# Patient Record
Sex: Male | Born: 1970
Health system: Southern US, Community
[De-identification: ages and names within clinical notes are randomized; demographics above are authoritative.]

## PROBLEM LIST (undated history)

## (undated) DIAGNOSIS — M419 Scoliosis, unspecified: Secondary | ICD-10-CM

## (undated) DIAGNOSIS — I341 Nonrheumatic mitral (valve) prolapse: Secondary | ICD-10-CM

## (undated) DIAGNOSIS — T148XXA Other injury of unspecified body region, initial encounter: Secondary | ICD-10-CM

## (undated) DIAGNOSIS — M722 Plantar fascial fibromatosis: Secondary | ICD-10-CM

## (undated) HISTORY — PX: WISDOM TOOTH EXTRACTION: SHX21

## (undated) HISTORY — DX: Plantar fascial fibromatosis: M72.2

## (undated) HISTORY — PX: VASECTOMY: SHX75

---

## 2007-05-18 ENCOUNTER — Ambulatory Visit: Payer: Self-pay | Admitting: Cardiology

## 2007-05-22 ENCOUNTER — Ambulatory Visit: Payer: Self-pay

## 2007-05-22 ENCOUNTER — Encounter: Payer: Self-pay | Admitting: Cardiology

## 2007-05-22 ENCOUNTER — Ambulatory Visit: Payer: Self-pay | Admitting: Cardiology

## 2010-11-27 NOTE — Assessment & Plan Note (Signed)
[facility]                            CARDIOLOGY OFFICE NOTE   Christopher Maxwell, Christopher Maxwell                      MRN:          [id number]  DATE:05/18/2007                            DOB:          Feb 15, 1971    CARDIOLOGY CONSULTATION:   REQUESTING Zeidy Tayag:  Paulene Floor, Nurse Practitioner with [facility].   REASON FOR VISIT:  Palpitations and history of mitral valve prolapse.   HISTORY OF PRESENT ILLNESS:  Christopher Maxwell is a pleasant 40 year old male  with a possible history of mitral valve prolapse diagnosed around age  83.  He reports being told at that time that he had mitral valve  prolapse based on physical examination and in the setting of episodic  rapid palpitations that typically lasted no more than 5 minutes.  Some  of these episodes would occur suddenly with activity, although he did  have episodes at rest and was not particularly bothered by them,  occurring perhaps only once a year.  He never had any associated  syncope.  He has not been on any specific medications for this.  He  reports over the last year he has had 2-3 episodes, the most recent of  which lasted up to 30 minutes and occurred approximately 3 weeks ago,  waking him up from sleep.  Other than this he continues to have no  problems with syncope and has had no exertional chest pain or  breathlessness.  His electrocardiogram today shows a sinus rhythm with a  fairly vertical axis and an incomplete right bundle branch block pattern  with decreased R wave progression noted anteriorly.  The pattern is not  classic for Brugada.  He also has a borderline short PR interval of 156  milliseconds with some slurring of the initial portion of the QRS  raising the possibility of pre-excitation.  He had a tracing sent over  from [facility] that shows similar changes.  He  does report wearing a monitor many years ago as a child but nothing more  recently, and he has not undergone an echocardiogram.   ALLERGIES:  PENICILLIN.   PRESENT MEDICATIONS:  None.   SOCIAL HISTORY:  The patient is married.  He has one child.  He works as  a Insurance account manager.  He works on the  night shift up to 14 hours a day and reports increased stress with his  job.  He drinks approximately 2 alcoholic beverages a day.  He smokes 1  pack per day, he has done so for 20 years.  No illicit substance use  since 1991.  No regular exercise at this time.   FAMILY HISTORY:  Noncontributory for premature cardiovascular disease or  sudden cardiac death.  Both mother and father are alive in their 3s to  33s.   REVIEW OF SYSTEMS:  Described in the history of present illness.  The  patient reports back problems/scoliosis and has seen a chiropractor for  this.   PAST MEDICAL HISTORY:  As outlined above.  The patient reports wisdom  teeth extraction in the  past.  No other major surgeries or hospital  stays.   PHYSICAL EXAMINATION:  VITAL SIGNS:  Blood pressure today is 134/91,  heart rate is 77, weight is 123 pounds.  GENERAL:  This is a thin male in no acute distress.  HEENT:  Conjunctivae and lids are normal.  Pharynx is clear.  NECK:  Supple.  No elevated jugular venous pressure .  No loud bruits.  No thyromegaly is noted.  LUNGS:  Clear without labored breathing.  CARDIAC:  Reveals a regular rate and rhythm.  No obvious mid systolic  click is elicited either seated or standing.  No loud cardiac murmur.  No S3 gallop or pericardial rub.  CHEST:  Does have a pectus excavatum appearance.  ABDOMEN:  Soft, nontender.  Normoactive bowel sounds.  EXTREMITIES:  Show no significant pitting edema.  Distal pulses are 2  plus.  SKIN:  Warm and dry.  MUSCULOSKELETAL:  Scoliosis is noted.  NEUROPSYCHIATRIC:  The patient is alert and oriented x3.  Affect is  normal.   IMPRESSION/RECOMMENDATION:  Episodic palpitations without  syncope or  chest pain.  This is in the setting of a reported history of mitral  valve prolapse based on a childhood diagnosis.  His examination is not  notable for a mid systolic click at this time.  He does have a relative  pectus excavatum appearance, however.  His electrocardiogram does have  some subtle changes raising the possibility of a concealed pathway  perhaps.  No frank evidence of Brugada pattern.  At this point, I have  recommended a 2D echocardiogram to better assess valvular structure and  function and also a 30-day event recorder.  I will review the patient's  tracing with one of my electrophysiology colleagues.  Otherwise, I will  plan to see the patient back over the next month and we can discuss the  situation from there.   Further plans to follow.     Jonelle Sidle, MD  Electronically Signed    SGM/MedQ  DD: 05/18/2007  DT: 05/18/2007  Job #: [id number]   cc:   Paulene Floor, NP

## 2012-10-26 ENCOUNTER — Telehealth: Payer: Self-pay | Admitting: Nurse Practitioner

## 2012-10-26 ENCOUNTER — Encounter: Payer: Self-pay | Admitting: Family Medicine

## 2012-10-26 ENCOUNTER — Ambulatory Visit (INDEPENDENT_AMBULATORY_CARE_PROVIDER_SITE_OTHER): Payer: BC Managed Care – PPO | Admitting: Family Medicine

## 2012-10-26 VITALS — BP 118/83 | HR 78 | Temp 98.9°F | Ht 71.25 in | Wt 162.6 lb

## 2012-10-26 DIAGNOSIS — Z20828 Contact with and (suspected) exposure to other viral communicable diseases: Secondary | ICD-10-CM

## 2012-10-26 DIAGNOSIS — J209 Acute bronchitis, unspecified: Secondary | ICD-10-CM | POA: Insufficient documentation

## 2012-10-26 DIAGNOSIS — R509 Fever, unspecified: Secondary | ICD-10-CM

## 2012-10-26 LAB — POCT INFLUENZA A/B
Influenza A, POC: NEGATIVE
Influenza B, POC: NEGATIVE

## 2012-10-26 MED ORDER — AZITHROMYCIN 250 MG PO TABS: ORAL_TABLET | ORAL | Status: DC

## 2012-10-26 MED ORDER — OSELTAMIVIR PHOSPHATE 75 MG PO CAPS: 75.0000 mg | ORAL_CAPSULE | Freq: Two times a day (BID) | ORAL | Status: DC

## 2012-10-26 MED ORDER — HYDROCODONE-HOMATROPINE 5-1.5 MG/5ML PO SYRP: 5.0000 mL | ORAL_SOLUTION | Freq: Three times a day (TID) | ORAL | Status: DC | PRN

## 2012-10-26 NOTE — Progress Notes (Signed)
Patient ID: Christopher Maxwell, male   DOB: [birth year], 42 y.o.   MRN: [id number] SUBJECTIVE: HPI: Sore throat,, fever, for  3 days. Now coughing and having chills. Ex-smoker.no wheezing. No hemoptysis. No SOB. No Chest pain.  PMH/PSH: reviewed/updated in Epic  SH/FH: reviewed/updated in Epic  Allergies: reviewed/updated in Epic  Medications: reviewed/updated in Epic  Immunizations: reviewed/updated in Epic  ROS: As above in the HPI. All other systems are stable or negative.  OBJECTIVE: APPEARANCE:  WM NAD rattling cough Patient in no acute distress.The patient appeared well nourished and normally developed. Acyanotic. Waist: VITAL SIGNS: BP 118/83  Pulse 78  Temp(Src) 98.9 F (37.2 C) (Oral)  Ht 5' 11.25" (1.81 m)  Wt 162 lb 9.6 oz (73.755 kg)  BMI 22.51 kg/m2  SKIN: warm and  Dry without overt rashes, tattoos and scars  HEAD and Neck: without JVD, Head and scalp: normal Eyes:No scleral icterus. Fundi normal, eye movements normal. Ears: Auricle normal, canal normal, Tympanic membranes normal, insufflation normal. Nose: normal Throat: normal Neck & thyroid: normal  CHEST & LUNGS: Chest wall: normal Lungs:bilateral  rhonchi , no rales, no wheezes,  CVS: Reveals the PMI to be normally located. Regular rhythm, First and Second Heart sounds are normal,  absence of murmurs, rubs or gallops. Peripheral vasculature: Radial pulses: normal Dorsal pedis pulses: normal Posterior pulses: normal  ABDOMEN:  Appearance: normal Benign,, no organomegaly, no masses, no Abdominal Aortic enlargement. No Guarding , no rebound. No Bruits. Bowel sounds: normal  RECTAL: N/A GU: N/A  EXTREMETIES: nonedematous. Both Femoral and Pedal pulses are normal.  MUSCULOSKELETAL:  Spine: normal Joints: intact  NEUROLOGIC: oriented to time,place and person; nonfocal. Strength is normal Sensory is normal Reflexes are normal Cranial Nerves are normal.  ASSESSMENT: Fever, unspecified  - Plan: POCT Influenza A/B  Exposure to influenza  Acute bronchitis  PLAN:  Orders Placed This Encounter  Procedures  . POCT Influenza A/B   Results for orders placed in visit on 10/26/12 (from the past 24 hour(s))  POCT INFLUENZA A/B     Status: None   Collection Time    10/26/12  5:46 PM      Result Value Range   Influenza A, POC Negative     Influenza B, POC Negative     Meds ordered this encounter  Medications  . Cetirizine HCl (ZYRTEC ALLERGY PO)    Sig: Take by mouth.  Marland Kitchen azithromycin (ZITHROMAX) 250 MG tablet    Sig: 2 tabs on day 1 ,then  1 tab day 2 to 5    Dispense:  6 tablet    Refill:  0  . oseltamivir (TAMIFLU) 75 MG capsule    Sig: Take 1 capsule (75 mg total) by mouth 2 (two) times daily.    Dispense:  10 capsule    Refill:  0  . HYDROcodone-homatropine (HYCODAN) 5-1.5 MG/5ML syrup    Sig: Take 5 mLs by mouth every 8 (eight) hours as needed for cough.    Dispense:  120 mL    Refill:  0   OOW for  5 days Fluids rest RTC prn  Kevron Patella P. Modesto Charon, M.D.

## 2012-11-26 NOTE — Telephone Encounter (Signed)
Pt was seen on 4/14

## 2014-04-27 ENCOUNTER — Encounter: Payer: Self-pay | Admitting: Family

## 2014-04-27 ENCOUNTER — Ambulatory Visit (INDEPENDENT_AMBULATORY_CARE_PROVIDER_SITE_OTHER): Payer: BC Managed Care – PPO | Admitting: Family

## 2014-04-27 VITALS — BP 117/79 | HR 106 | Temp 98.1°F | Ht 71.0 in | Wt 168.8 lb

## 2014-04-27 DIAGNOSIS — M722 Plantar fascial fibromatosis: Secondary | ICD-10-CM

## 2014-04-27 MED ORDER — KETOROLAC TROMETHAMINE 60 MG/2ML IM SOLN
60.0000 mg | Freq: Once | INTRAMUSCULAR | Status: AC
  Administered 2014-04-27: 60 mg via INTRAMUSCULAR

## 2014-04-27 MED ORDER — MELOXICAM 15 MG PO TABS: 15.0000 mg | ORAL_TABLET | Freq: Every day | ORAL | Status: DC

## 2014-04-27 NOTE — Progress Notes (Signed)
   Subjective:    Patient ID: Christopher Maxwell, male    DOB: [birth year], 43 y.o.   MRN: [id number]  Ankle Pain  The incident occurred more than 1 week ago (Two weeks). There was no injury mechanism. The pain is present in the left heel and left ankle. The quality of the pain is described as shooting and stabbing. The pain is at a severity of 7/10. The pain is mild. The pain has been intermittent since onset. Pertinent negatives include no inability to bear weight, loss of motion, numbness or tingling. The symptoms are aggravated by weight bearing. He has tried NSAIDs and rest for the symptoms. The treatment provided mild relief.   *Pt saw Dr. Ulice Brilliantrake who did a X-ray which is WNL. Pt was giving steroid dose pack and antiinflammatory.    Review of Systems  Constitutional: Negative.   HENT: Negative.   Respiratory: Negative.   Cardiovascular: Negative.   Gastrointestinal: Negative.   Endocrine: Negative.   Genitourinary: Negative.   Musculoskeletal: Negative.   Neurological: Negative.  Negative for tingling and numbness.  Hematological: Negative.   Psychiatric/Behavioral: Negative.   All other systems reviewed and are negative.      Objective:   Physical Exam  Vitals reviewed. Constitutional: He is oriented to person, place, and time. He appears well-developed and well-nourished. No distress.  HENT:  Head: Normocephalic.  Right Ear: External ear normal.  Left Ear: External ear normal.  Mouth/Throat: Oropharynx is clear and moist.  Eyes: Pupils are equal, round, and reactive to light. Right eye exhibits no discharge. Left eye exhibits no discharge.  Neck: Normal range of motion. Neck supple. No thyromegaly present.  Cardiovascular: Normal rate, regular rhythm, normal heart sounds and intact distal pulses.   No murmur heard. Pulmonary/Chest: Effort normal and breath sounds normal. No respiratory distress. He has no wheezes.  Abdominal: Soft. Bowel sounds are normal. He exhibits no  distension. There is no tenderness.  Musculoskeletal: Normal range of motion. He exhibits tenderness. He exhibits no edema.  Tenderness in left foot with flexion  Neurological: He is alert and oriented to person, place, and time. He has normal reflexes. No cranial nerve deficit.  Skin: Skin is warm and dry. No rash noted. No erythema.  Psychiatric: He has a normal mood and affect. His behavior is normal. Judgment and thought content normal.    BP 117/79  Pulse 106  Temp(Src) 98.1 F (36.7 C) (Oral)  Ht 5\' 11"  (1.803 m)  Wt 168 lb 12.8 oz (76.567 kg)  BMI 23.55 kg/m2       Assessment & Plan:  1. Plantar fasciitis of left foot -rest -Freeze water bottle and roll over -Foot strectches - ketorolac (TORADOL) injection 60 mg; Inject 2 mLs (60 mg total) into the muscle once. - meloxicam (MOBIC) 15 MG tablet; Take 1 tablet (15 mg total) by mouth daily.  Dispense: 30 tablet; Refill: 1  Jannifer Rodneyhristy Hawks, FNP

## 2014-04-27 NOTE — Patient Instructions (Signed)
Plantar Fasciitis  Plantar fasciitis is a common condition that causes foot pain. It is soreness (inflammation) of the band of tough fibrous tissue on the bottom of the foot that runs from the heel bone (calcaneus) to the ball of the foot. The cause of this soreness may be from excessive standing, poor fitting shoes, running on hard surfaces, being overweight, having an abnormal walk, or overuse (this is common in runners) of the painful foot or feet. It is also common in aerobic exercise dancers and ballet dancers.  SYMPTOMS   Most people with plantar fasciitis complain of:   Severe pain in the morning on the bottom of their foot especially when taking the first steps out of bed. This pain recedes after a few minutes of walking.   Severe pain is experienced also during walking following a long period of inactivity.   Pain is worse when walking barefoot or up stairs  DIAGNOSIS    Your caregiver will diagnose this condition by examining and feeling your foot.   Special tests such as X-rays of your foot, are usually not needed.  PREVENTION    Consult a sports medicine professional before beginning a new exercise program.   Walking programs offer a good workout. With walking there is a lower chance of overuse injuries common to runners. There is less impact and less jarring of the joints.   Begin all new exercise programs slowly. If problems or pain develop, decrease the amount of time or distance until you are at a comfortable level.   Wear good shoes and replace them regularly.   Stretch your foot and the heel cords at the back of the ankle (Achilles tendon) both before and after exercise.   Run or exercise on even surfaces that are not hard. For example, asphalt is better than pavement.   Do not run barefoot on hard surfaces.   If using a treadmill, vary the incline.   Do not continue to workout if you have foot or joint problems. Seek professional help if they do not improve.  HOME CARE INSTRUCTIONS     Avoid activities that cause you pain until you recover.   Use ice or cold packs on the problem or painful areas after working out.   Only take over-the-counter or prescription medicines for pain, discomfort, or fever as directed by your caregiver.   Soft shoe inserts or athletic shoes with air or gel sole cushions may be helpful.   If problems continue or become more severe, consult a sports medicine caregiver or your own health care provider. Cortisone is a potent anti-inflammatory medication that may be injected into the painful area. You can discuss this treatment with your caregiver.  MAKE SURE YOU:    Understand these instructions.   Will watch your condition.   Will get help right away if you are not doing well or get worse.  Document Released: 03/26/2001 Document Revised: 09/23/2011 Document Reviewed: 05/25/2008  ExitCare Patient Information 2015 [employer]. This information is not intended to replace advice given to you by your health care provider. Make sure you discuss any questions you have with your health care provider.

## 2014-12-08 ENCOUNTER — Encounter: Payer: Self-pay | Admitting: Family Medicine

## 2014-12-08 ENCOUNTER — Ambulatory Visit (INDEPENDENT_AMBULATORY_CARE_PROVIDER_SITE_OTHER): Payer: BLUE CROSS/BLUE SHIELD | Admitting: Family Medicine

## 2014-12-08 VITALS — BP 125/82 | HR 87 | Temp 97.4°F | Ht 71.0 in | Wt 168.2 lb

## 2014-12-08 DIAGNOSIS — Z1212 Encounter for screening for malignant neoplasm of rectum: Secondary | ICD-10-CM

## 2014-12-08 DIAGNOSIS — Z0000 Encounter for general adult medical examination without abnormal findings: Secondary | ICD-10-CM | POA: Diagnosis not present

## 2014-12-08 DIAGNOSIS — R7309 Other abnormal glucose: Secondary | ICD-10-CM | POA: Diagnosis not present

## 2014-12-08 LAB — POCT CBC
Granulocyte percent: 60.5 %G (ref 37–80)
HEMATOCRIT: 48.6 % (ref 43.5–53.7)
Hemoglobin: 15.9 g/dL (ref 14.1–18.1)
Lymph, poc: 1.8 (ref 0.6–3.4)
MCH: 27.9 pg (ref 27–31.2)
MCHC: 32.7 g/dL (ref 31.8–35.4)
MCV: 85.4 fL (ref 80–97)
MPV: 8.2 fL (ref 0–99.8)
POC Granulocyte: 3.1 (ref 2–6.9)
POC LYMPH PERCENT: 34.7 %L (ref 10–50)
Platelet Count, POC: 224 10*3/uL (ref 142–424)
RBC: 5.69 M/uL (ref 4.69–6.13)
RDW, POC: 13 %
WBC: 5.2 10*3/uL (ref 4.6–10.2)

## 2014-12-08 LAB — POCT GLYCOSYLATED HEMOGLOBIN (HGB A1C): Hemoglobin A1C: 5.3

## 2014-12-08 NOTE — Progress Notes (Signed)
Subjective:  Patient ID: Christopher Maxwell, male    DOB: 1971/03/12  Age: 44 y.o. MRN: [id number]  CC: Annual Exam   HPI Christopher Maxwell presents for annual examination. He has been very healthy without any serious medical problems. He no longer takes any of the medications listed below. His only concern currently is that he injured himself during [email or web address] last week. He bent his penis excessively while erect and thrusting. It caused intense pain. Since then he's felt a hard knot under surface of the penis and there is a slight dorsal bend in the penis at that site. He is still able to completely insert his penis for intercourse. The pain has resolved.  History Christopher Maxwell has no past medical history on file.   He has past surgical history that includes Wisdom tooth extraction and Vasectomy.   His family history includes Diabetes in his father.He reports that he quit smoking about 3 years ago. His smoking use included Cigarettes. He does not have any smokeless tobacco history on file. He reports that he drinks alcohol. He reports that he does not use illicit drugs.  Outpatient Prescriptions Prior to Visit  Medication Sig Dispense Refill  . DICLOFENAC PO Take 75 mg by mouth 2 (two) times daily.    . meloxicam (MOBIC) 15 MG tablet Take 1 tablet (15 mg total) by mouth daily. (Patient not taking: Reported on 12/08/2014) 30 tablet 1  . methylPREDNISolone (MEDROL DOSEPAK) 4 MG tablet Take by mouth. follow package directions    . Cetirizine HCl (ZYRTEC ALLERGY PO) Take by mouth.     No facility-administered medications prior to visit.    ROS Review of Systems  Constitutional: Negative for fever, chills, diaphoresis, activity change, appetite change, fatigue and unexpected weight change.  HENT: Negative for congestion, ear pain, hearing loss, postnasal drip, rhinorrhea, sore throat, tinnitus and trouble swallowing.   Eyes: Negative for photophobia, pain, discharge and redness.    Respiratory: Negative for apnea, cough, choking, chest tightness, shortness of breath, wheezing and stridor.   Cardiovascular: Negative for chest pain, palpitations and leg swelling.  Gastrointestinal: Negative for nausea, vomiting, abdominal pain, diarrhea, constipation, blood in stool and abdominal distention.  Endocrine: Negative for cold intolerance, heat intolerance, polydipsia, polyphagia and polyuria.  Genitourinary: Negative for dysuria, urgency, frequency, hematuria, flank pain, enuresis, difficulty urinating and genital sores.  Musculoskeletal: Negative for joint swelling and arthralgias.  Skin: Negative for color change, rash and wound.  Allergic/Immunologic: Negative for immunocompromised state.  Neurological: Negative for dizziness, tremors, seizures, syncope, facial asymmetry, speech difficulty, weakness, light-headedness, numbness and headaches.  Hematological: Does not bruise/bleed easily.  Psychiatric/Behavioral: Negative for suicidal ideas, hallucinations, behavioral problems, confusion, sleep disturbance, dysphoric mood, decreased concentration and agitation. The patient is not nervous/anxious and is not hyperactive.     Objective:  BP 125/82 mmHg  Pulse 87  Temp(Src) 97.4 F (36.3 C) (Oral)  Ht 5\' 11"  (1.803 m)  Wt 168 lb 3.2 oz (76.295 kg)  BMI 23.47 kg/m2  BP Readings from Last 3 Encounters:  12/08/14 125/82  04/27/14 117/79  10/26/12 118/83    Wt Readings from Last 3 Encounters:  12/08/14 168 lb 3.2 oz (76.295 kg)  04/27/14 168 lb 12.8 oz (76.567 kg)  10/26/12 162 lb 9.6 oz (73.755 kg)     Physical Exam  Constitutional: He is oriented to person, place, and time. He appears well-developed and well-nourished.  HENT:  Head: Normocephalic and atraumatic.  Mouth/Throat: Oropharynx is clear and moist.  Eyes: EOM are normal.  Pupils are equal, round, and reactive to light.  Neck: Normal range of motion. No tracheal deviation present. No thyromegaly present.   Cardiovascular: Normal rate, regular rhythm and normal heart sounds.  Exam reveals no gallop and no friction rub.   No murmur heard. Pulmonary/Chest: Breath sounds normal. He has no wheezes. He has no rales.  Abdominal: Soft. He exhibits no mass. There is no tenderness.  Genitourinary: Rectum normal and prostate normal. Penile tenderness (mild tenderness at the mid shaft where there is a 2 x 4 mm induration deep in the tissue palpable both ventrally and dorsally) present.  Musculoskeletal: Normal range of motion. He exhibits no edema.  Neurological: He is alert and oriented to person, place, and time.  Skin: Skin is warm and dry.  Psychiatric: He has a normal mood and affect.    Lab Results  Component Value Date   HGBA1C 5.3 12/08/2014    Lab Results  Component Value Date   WBC 5.2 12/08/2014   HGB 15.9 12/08/2014   HCT 48.6 12/08/2014   HGBA1C 5.3 12/08/2014    No results found.  Assessment & Plan:   [name] was seen today for annual exam.  Diagnoses and all orders for this visit:  Routine general medical examination at a health care facility Orders: -     POCT CBC -     CMP14+EGFR -     NMR, lipoprofile -     PSA, total and free -     Thyroid Panel With TSH -     Vit D  25 hydroxy (rtn osteoporosis monitoring)  Abnormal glucose Orders: -     POCT glycosylated hemoglobin (Hb A1C)  Screening for malignant neoplasm of the rectum Orders: -     Fecal occult blood, imunochemical   Penile contusion. Patient reassured this should resolve over the next several weeks. If he notices that it does not and he has further curvature noted, urology referral for development of Reiter's syndrome will be made  No orders of the defined types were placed in this encounter.     Follow-up: No Follow-up on file.  Claretta Fraise, M.D.

## 2014-12-09 ENCOUNTER — Other Ambulatory Visit: Payer: Self-pay | Admitting: Family Medicine

## 2014-12-09 LAB — PSA, TOTAL AND FREE
PROSTATE SPECIFIC AG, SERUM: 0.8 ng/mL (ref 0.0–4.0)
PSA FREE PCT: 70 %
PSA FREE: 0.56 ng/mL

## 2014-12-09 LAB — VITAMIN D 25 HYDROXY (VIT D DEFICIENCY, FRACTURES): VIT D 25 HYDROXY: 16.3 ng/mL — AB (ref 30.0–100.0)

## 2014-12-09 LAB — CMP14+EGFR
ALT: 19 IU/L (ref 0–44)
AST: 17 IU/L (ref 0–40)
Albumin/Globulin Ratio: 1.8 (ref 1.1–2.5)
Albumin: 4.8 g/dL (ref 3.5–5.5)
Alkaline Phosphatase: 67 IU/L (ref 39–117)
BUN / CREAT RATIO: 18 (ref 9–20)
BUN: 18 mg/dL (ref 6–24)
Bilirubin Total: 0.5 mg/dL (ref 0.0–1.2)
CHLORIDE: 100 mmol/L (ref 97–108)
CO2: 26 mmol/L (ref 18–29)
CREATININE: 1.01 mg/dL (ref 0.76–1.27)
Calcium: 9.9 mg/dL (ref 8.7–10.2)
GFR calc Af Amer: 106 mL/min/{1.73_m2} (ref >59)
GFR calc non Af Amer: 91 mL/min/{1.73_m2} (ref >59)
Globulin, Total: 2.7 g/dL (ref 1.5–4.5)
Glucose: 105 mg/dL — ABNORMAL HIGH (ref 65–99)
Potassium: 5.2 mmol/L (ref 3.5–5.2)
Sodium: 141 mmol/L (ref 134–144)
Total Protein: 7.5 g/dL (ref 6.0–8.5)

## 2014-12-09 LAB — NMR, LIPOPROFILE
CHOLESTEROL: 163 mg/dL (ref 100–199)
HDL Cholesterol by NMR: 60 mg/dL (ref >39)
HDL PARTICLE NUMBER: 36.8 umol/L (ref >=30.5)
LDL PARTICLE NUMBER: 1006 nmol/L — AB (ref <1000)
LDL Size: 21.7 nm (ref >20.5)
LDL-C: 90 mg/dL (ref 0–99)
LP-IR SCORE: 37 (ref <=45)
Small LDL Particle Number: 278 nmol/L (ref <=527)
Triglycerides by NMR: 63 mg/dL (ref 0–149)

## 2014-12-09 LAB — THYROID PANEL WITH TSH
FREE THYROXINE INDEX: 2.6 (ref 1.2–4.9)
T3 UPTAKE RATIO: 28 % (ref 24–39)
T4, Total: 9.2 ug/dL (ref 4.5–12.0)
TSH: 1.97 u[IU]/mL (ref 0.450–4.500)

## 2014-12-09 MED ORDER — VITAMIN D (ERGOCALCIFEROL) 1.25 MG (50000 UNIT) PO CAPS: 50000.0000 [IU] | ORAL_CAPSULE | ORAL | Status: DC

## 2014-12-10 LAB — FECAL OCCULT BLOOD, IMMUNOCHEMICAL: Fecal Occult Bld: NEGATIVE

## 2014-12-26 ENCOUNTER — Encounter: Payer: Self-pay | Admitting: Family Medicine

## 2014-12-26 ENCOUNTER — Ambulatory Visit (INDEPENDENT_AMBULATORY_CARE_PROVIDER_SITE_OTHER): Payer: BLUE CROSS/BLUE SHIELD | Admitting: Family Medicine

## 2014-12-26 VITALS — BP 130/85 | HR 121 | Temp 99.8°F | Ht 71.0 in | Wt 169.4 lb

## 2014-12-26 DIAGNOSIS — J209 Acute bronchitis, unspecified: Secondary | ICD-10-CM

## 2014-12-26 DIAGNOSIS — R52 Pain, unspecified: Secondary | ICD-10-CM

## 2014-12-26 DIAGNOSIS — J029 Acute pharyngitis, unspecified: Secondary | ICD-10-CM

## 2014-12-26 LAB — POCT CBC
Granulocyte percent: 93.2 %G — AB (ref 37–80)
HCT, POC: 50.6 % (ref 43.5–53.7)
HEMOGLOBIN: 16.3 g/dL (ref 14.1–18.1)
Lymph, poc: 0.7 (ref 0.6–3.4)
MCH, POC: 27.7 pg (ref 27–31.2)
MCHC: 32.3 g/dL (ref 31.8–35.4)
MCV: 86 fL (ref 80–97)
MPV: 7.8 fL (ref 0–99.8)
POC Granulocyte: 12 — AB (ref 2–6.9)
POC LYMPH PERCENT: 5.2 %L — AB (ref 10–50)
Platelet Count, POC: 231 10*3/uL (ref 142–424)
RBC: 5.89 M/uL (ref 4.69–6.13)
RDW, POC: 13.1 %
WBC: 12.9 10*3/uL — AB (ref 4.6–10.2)

## 2014-12-26 LAB — POCT RAPID STREP A (OFFICE): Rapid Strep A Screen: NEGATIVE

## 2014-12-26 LAB — POCT INFLUENZA A/B
INFLUENZA A, POC: NEGATIVE
INFLUENZA B, POC: NEGATIVE

## 2014-12-26 MED ORDER — HYDROCODONE-HOMATROPINE 5-1.5 MG/5ML PO SYRP: 5.0000 mL | ORAL_SOLUTION | Freq: Four times a day (QID) | ORAL | Status: DC | PRN

## 2014-12-26 MED ORDER — LEVOFLOXACIN 500 MG PO TABS: 500.0000 mg | ORAL_TABLET | Freq: Every day | ORAL | Status: DC

## 2014-12-26 NOTE — Addendum Note (Signed)
Addended by: Bearl Mulberry on: 12/26/2014 10:35 AM   Modules accepted: Kipp Brood

## 2014-12-26 NOTE — Progress Notes (Addendum)
Subjective:  Patient ID: Christopher Maxwell, male    DOB: 1970-12-26  Age: 44 y.o. MRN: [id number]  CC: Cough; Sore Throat; and Fever   HPI Joseenrique Sifers presents for onset night before last of cough and hoarseness after attending a musical performance of the club. Yesterday he coughed with scant productivity. He has had a sore throat since yesterday morning. Fever has been subjective with noted chills and sweats. He has multiple body aches in the major joints and major muscle groups including the upper back and lower back, thighs and hips and legs. Minimal headache when he coughs.  History Aundray has a past medical history of Plantar fasciitis, bilateral.   He has past surgical history that includes Wisdom tooth extraction and Vasectomy.   His family history includes Diabetes in his father.He reports that he quit smoking about 3 years ago. His smoking use included Cigarettes. He does not have any smokeless tobacco history on file. He reports that he drinks about 1.2 oz of alcohol per week. He reports that he does not use illicit drugs.  Current Outpatient Prescriptions on File Prior to Visit  Medication Sig Dispense Refill  . Vitamin D, Ergocalciferol, (DRISDOL) 50000 UNITS CAPS capsule Take 1 capsule (50,000 Units total) by mouth 2 (two) times a week. 16 capsule 0   No current facility-administered medications on file prior to visit.    ROS Review of Systems  Constitutional: Negative for fever, chills, activity change and appetite change.  HENT: Positive for congestion, postnasal drip, rhinorrhea and sinus pressure. Negative for ear discharge, ear pain, hearing loss, nosebleeds, sneezing and trouble swallowing.   Respiratory: Negative for chest tightness and shortness of breath.   Cardiovascular: Negative for chest pain and palpitations.  Skin: Negative for rash.    Objective:  BP 130/85 mmHg  Pulse 121  Temp(Src) 99.8 F (37.7 C)  Ht 5\' 11"  (1.803 m)  Wt 169 lb 6.4 oz  (76.839 kg)  BMI 23.64 kg/m2  Physical Exam  Constitutional: He appears well-developed and well-nourished.  HENT:  Head: Normocephalic and atraumatic.  Right Ear: Tympanic membrane and external ear normal. No decreased hearing is noted.  Left Ear: Tympanic membrane and external ear normal. No decreased hearing is noted.  Nose: Mucosal edema present. Right sinus exhibits no frontal sinus tenderness. Left sinus exhibits no frontal sinus tenderness.  Mouth/Throat: No oropharyngeal exudate or posterior oropharyngeal erythema.  Neck: No Brudzinski's sign noted.  Pulmonary/Chest: Breath sounds normal. No respiratory distress.  Lymphadenopathy:       Head (right side): No preauricular adenopathy present.       Head (left side): No preauricular adenopathy present.       Right cervical: No superficial cervical adenopathy present.      Left cervical: No superficial cervical adenopathy present.    Assessment & Plan:   Alok was seen today for cough, sore throat and fever.  Diagnoses and all orders for this visit:  Body aches Orders: -     POCT Influenza A/B -     POCT rapid strep A -     POCT CBC  Sore throat Orders: -     POCT Influenza A/B -     POCT rapid strep A -     POCT CBC  Acute bronchitis, unspecified organism  Other orders -     levofloxacin (LEVAQUIN) 500 MG tablet; Take 1 tablet (500 mg total) by mouth daily. -     HYDROcodone-homatropine (HYCODAN) 5-1.5 MG/5ML syrup; Take 5 mLs  by mouth every 6 (six) hours as needed for cough.   I have discontinued Mr. Oak's DICLOFENAC PO, methylPREDNISolone, and meloxicam. I am also having him start on levofloxacin and HYDROcodone-homatropine. Additionally, I am having him maintain his Vitamin D (Ergocalciferol).  Meds ordered this encounter  Medications  . levofloxacin (LEVAQUIN) 500 MG tablet    Sig: Take 1 tablet (500 mg total) by mouth daily.    Dispense:  10 tablet    Refill:  0  . HYDROcodone-homatropine (HYCODAN)  5-1.5 MG/5ML syrup    Sig: Take 5 mLs by mouth every 6 (six) hours as needed for cough.    Dispense:  120 mL    Refill:  0     Follow-up: Return if symptoms worsen or fail to improve.  Mechele Claude, M.D.

## 2014-12-26 NOTE — Addendum Note (Signed)
Addended by: Mechele Claude on: 12/26/2014 10:30 AM   Modules accepted: Kipp Brood

## 2014-12-26 NOTE — Addendum Note (Signed)
Addended by: Mechele Claude on: 12/26/2014 10:30 AM   Modules accepted: Orders, SmartSet

## 2015-11-06 DIAGNOSIS — Z008 Encounter for other general examination: Secondary | ICD-10-CM | POA: Diagnosis not present

## 2015-11-06 DIAGNOSIS — R7301 Impaired fasting glucose: Secondary | ICD-10-CM | POA: Diagnosis not present

## 2015-11-06 DIAGNOSIS — Z1389 Encounter for screening for other disorder: Secondary | ICD-10-CM | POA: Diagnosis not present

## 2015-11-16 ENCOUNTER — Ambulatory Visit (INDEPENDENT_AMBULATORY_CARE_PROVIDER_SITE_OTHER): Payer: BLUE CROSS/BLUE SHIELD | Admitting: Family Medicine

## 2015-11-16 ENCOUNTER — Encounter: Payer: Self-pay | Admitting: Family Medicine

## 2015-11-16 ENCOUNTER — Other Ambulatory Visit: Payer: Self-pay

## 2015-11-16 VITALS — BP 113/83 | HR 79 | Temp 97.1°F | Ht 71.0 in | Wt 171.4 lb

## 2015-11-16 DIAGNOSIS — N50812 Left testicular pain: Secondary | ICD-10-CM

## 2015-11-16 DIAGNOSIS — N5082 Scrotal pain: Secondary | ICD-10-CM

## 2015-11-16 LAB — URINALYSIS, COMPLETE
BILIRUBIN UA: NEGATIVE
Glucose, UA: NEGATIVE
LEUKOCYTES UA: NEGATIVE
NITRITE UA: NEGATIVE
PH UA: 5.5 (ref 5.0–7.5)
Protein, UA: NEGATIVE
RBC, UA: NEGATIVE
Specific Gravity, UA: >1.03 — ABNORMAL HIGH (ref 1.005–1.030)
Urobilinogen, Ur: 0.2 mg/dL (ref 0.2–1.0)

## 2015-11-16 LAB — MICROSCOPIC EXAMINATION
Bacteria, UA: NONE SEEN
EPITHELIAL CELLS (NON RENAL): NONE SEEN /HPF (ref 0–10)
RBC MICROSCOPIC, UA: NONE SEEN /HPF (ref 0–?)

## 2015-11-16 MED ORDER — SULFAMETHOXAZOLE-TRIMETHOPRIM 800-160 MG PO TABS: 1.0000 | ORAL_TABLET | Freq: Two times a day (BID) | ORAL | Status: DC

## 2015-11-16 NOTE — Progress Notes (Signed)
BP 113/83 mmHg  Pulse 79  Temp(Src) 97.1 F (36.2 C) (Oral)  Ht 5\' 11"  (1.803 m)  Wt 171 lb 6.4 oz (77.747 kg)  BMI 23.92 kg/m2   Subjective:    Patient ID: Christopher CampbellJonathan Kolbeck, male    DOB: 11-Aug-1970, 45 y.o.   MRN: [id number]  HPI: Christopher Maxwell is a 45 y.o. male presenting on 11/16/2015 for Sinusitis and Groin Pain   HPI Left testicular pain Patient comes in because he has been having left testicular pain for the past 2 days. He awoke in the morning with this pain on that left side. He has not noticed anything that makes it worse or better. He has not noticed any dysuria or hematuria. He has not had any penile discharge. He denies any fevers or chills. The pain does not radiate anywhere else. The pain is described as sharp. He has never had pain similar to this before. He denies any swelling that is noticed. There is also NO ERYTHEMA.   Relevant past medical, surgical, family and social history reviewed and updated as indicated. Interim medical history since our last visit reviewed. Allergies and medications reviewed and updated.  Review of Systems  Constitutional: Negative for fever.  HENT: Negative for ear discharge and ear pain.   Eyes: Negative for discharge and visual disturbance.  Respiratory: Negative for shortness of breath and wheezing.   Cardiovascular: Negative for chest pain and leg swelling.  Gastrointestinal: Negative for abdominal pain, diarrhea, constipation and abdominal distention.  Genitourinary: Positive for scrotal swelling and testicular pain. Negative for dysuria, urgency, frequency, discharge, penile swelling, difficulty urinating and penile pain.  Musculoskeletal: Negative for back pain and gait problem.  Skin: Negative for rash.  Neurological: Negative for syncope, light-headedness and headaches.  All other systems reviewed and are negative.   Per HPI unless specifically indicated above     Medication List       This list is accurate as of:  11/16/15 10:39 AM.  Always use your most recent med list.               cholecalciferol 1000 units tablet  Commonly known as:  VITAMIN D  Take 1,000 Units by mouth daily.           Objective:    BP 113/83 mmHg  Pulse 79  Temp(Src) 97.1 F (36.2 C) (Oral)  Ht 5\' 11"  (1.803 m)  Wt 171 lb 6.4 oz (77.747 kg)  BMI 23.92 kg/m2  Wt Readings from Last 3 Encounters:  11/16/15 171 lb 6.4 oz (77.747 kg)  12/26/14 169 lb 6.4 oz (76.839 kg)  12/08/14 168 lb 3.2 oz (76.295 kg)    Physical Exam  Constitutional: He is oriented to person, place, and time. He appears well-developed and well-nourished. No distress.  Eyes: Conjunctivae and EOM are normal. Pupils are equal, round, and reactive to light. Right eye exhibits no discharge. No scleral icterus.  Neck: Neck supple. No thyromegaly present.  Cardiovascular: Normal rate, regular rhythm, normal heart sounds and intact distal pulses.   No murmur heard. Pulmonary/Chest: Effort normal and breath sounds normal. No respiratory distress. He has no wheezes.  Abdominal: Soft. Normal appearance and bowel sounds are normal. There is no hepatosplenomegaly. There is no tenderness. There is no CVA tenderness. Hernia confirmed negative in the right inguinal area and confirmed negative in the left inguinal area.  Genitourinary: Penis normal. Right testis shows no mass, no swelling and no tenderness. Right testis is descended. Cremasteric reflex is not  absent on the right side. Left testis shows tenderness. Left testis shows no mass and no swelling. Left testis is descended. Cremasteric reflex is not absent on the left side. Circumcised. No penile tenderness. No discharge found.  Musculoskeletal: Normal range of motion. He exhibits no edema.  Lymphadenopathy:    He has no cervical adenopathy.       Right: No inguinal adenopathy present.       Left: No inguinal adenopathy present.  Neurological: He is alert and oriented to person, place, and time.  Coordination normal.  Skin: Skin is warm and dry. No rash noted. He is not diaphoretic.  Psychiatric: He has a normal mood and affect. His behavior is normal.  Vitals reviewed.  Urine showed 0-2 WBCs and mucus but otherwise clear    Assessment & Plan:   Problem List Items Addressed This Visit    None    Visit Diagnoses    Left testicular pain    -  Primary    Relevant Medications    sulfamethoxazole-trimethoprim (BACTRIM DS) 800-160 MG tablet    Other Relevant Orders    Urinalysis, Complete (Completed)    US Scrotum (Completed)        Follow up plan: Return if symptoms worsen or fail to improve.  Counseling provided for all of the vaccine components Orders Placed This Encounter  Procedures  . Urinalysis, Complete    Arville Care, MD [facility] 11/16/2015, 10:39 AM

## 2015-11-17 ENCOUNTER — Ambulatory Visit (HOSPITAL_COMMUNITY)
Admission: RE | Admit: 2015-11-17 | Discharge: 2015-11-17 | Disposition: A | Discharge status: Home / Self Care | Payer: BLUE CROSS/BLUE SHIELD | Source: Ambulatory Visit | Attending: Family Medicine | Admitting: Family Medicine

## 2015-11-17 DIAGNOSIS — N50812 Left testicular pain: Secondary | ICD-10-CM | POA: Insufficient documentation

## 2015-11-17 DIAGNOSIS — I861 Scrotal varices: Secondary | ICD-10-CM | POA: Diagnosis not present

## 2015-11-17 DIAGNOSIS — N5082 Scrotal pain: Secondary | ICD-10-CM | POA: Diagnosis not present

## 2016-01-24 ENCOUNTER — Encounter: Payer: Self-pay | Admitting: Family Medicine

## 2016-01-24 ENCOUNTER — Ambulatory Visit (INDEPENDENT_AMBULATORY_CARE_PROVIDER_SITE_OTHER): Payer: BLUE CROSS/BLUE SHIELD | Admitting: Family Medicine

## 2016-01-24 DIAGNOSIS — Z23 Encounter for immunization: Secondary | ICD-10-CM | POA: Diagnosis not present

## 2016-01-24 DIAGNOSIS — Z0000 Encounter for general adult medical examination without abnormal findings: Secondary | ICD-10-CM

## 2016-01-24 DIAGNOSIS — R7309 Other abnormal glucose: Secondary | ICD-10-CM | POA: Diagnosis not present

## 2016-01-24 DIAGNOSIS — M4120 Other idiopathic scoliosis, site unspecified: Secondary | ICD-10-CM | POA: Insufficient documentation

## 2016-01-24 NOTE — Progress Notes (Signed)
BP 111/74 mmHg  Pulse 76  Temp(Src) 98 F (36.7 C) (Oral)  Ht _0  (1.803 m)  Wt 174 lb (78.926 kg)  BMI 24.28 kg/m2   Subjective:    Patient ID: Christopher Maxwell, male    DOB: [birth year], 45 y.o.   MRN: [id number]  HPI: Christopher Maxwell is a 45 y.o. male presenting on 01/24/2016 for cpe   HPI Well Adult exam and labs Patient comes in today for a well adult exam and fasting labs. He has been told previously that he may be on the edge of developing diabetes. He feels well and does not have any major issues. He was diagnosed with a varicocele recently been treated for epididymitis which resolved. He has known scoliosis that he has had his whole life. He does not have any significant pain from it (a chiropractor occasionally to help with any issues that may arise from it. He denies any chest pain, shortness of breath, headaches or vision issues, abdominal complaints, diarrhea, nausea, vomiting, or joint issues.   Relevant past medical, surgical, family and social history reviewed and updated as indicated. Interim medical history since our last visit reviewed. Allergies and medications reviewed and updated.  Review of Systems  Constitutional: Negative for fever and appetite change.  HENT: Negative for ear discharge and ear pain.   Eyes: Negative for discharge and visual disturbance.  Respiratory: Negative for shortness of breath and wheezing.   Cardiovascular: Negative for chest pain and leg swelling.  Gastrointestinal: Negative for abdominal pain, diarrhea and constipation.  Genitourinary: Negative for difficulty urinating.  Musculoskeletal: Negative for back pain and gait problem.  Skin: Negative for rash.  Neurological: Negative for syncope, light-headedness and headaches.  All other systems reviewed and are negative.   Per HPI unless specifically indicated above     Medication List       This list is accurate as of: 01/24/16  8:39 AM.  Always use your most recent med list.                cholecalciferol 1000 units tablet  Commonly known as:  VITAMIN D  Take 1,000 Units by mouth daily.           Objective:    BP 111/74 mmHg  Pulse 76  Temp(Src) 98 F (36.7 C) (Oral)  Ht _1  (1.803 m)  Wt 174 lb (78.926 kg)  BMI 24.28 kg/m2  Wt Readings from Last 3 Encounters:  01/24/16 174 lb (78.926 kg)  11/16/15 171 lb 6.4 oz (77.747 kg)  12/26/14 169 lb 6.4 oz (76.839 kg)    Physical Exam  Constitutional: He is oriented to person, place, and time. He appears well-developed and well-nourished. No distress.  Eyes: Conjunctivae and EOM are normal. Pupils are equal, round, and reactive to light. Right eye exhibits no discharge. No scleral icterus.  Neck: Neck supple. No thyromegaly present.  Cardiovascular: Normal rate, regular rhythm, normal heart sounds and intact distal pulses.   No murmur heard. Pulmonary/Chest: Effort normal and breath sounds normal. No respiratory distress. He has no wheezes.  Musculoskeletal: Normal range of motion. He exhibits no edema.       Thoracic back: He exhibits deformity (Patient has scoliosis with convex right in the thoracic and left in lumbar, greater in thoracic). He exhibits normal range of motion and no tenderness.  Lymphadenopathy:    He has no cervical adenopathy.  Neurological: He is alert and oriented to person, place, and time. Coordination normal.  Skin: Skin  is warm and dry. No rash noted. He is not diaphoretic.  Psychiatric: He has a normal mood and affect. His behavior is normal.  Nursing note and vitals reviewed.     Assessment & Plan:       Problem List Items Addressed This Visit    None    Visit Diagnoses    Well adult exam        Relevant Orders    CBC with Differential/Platelet    CMP14+EGFR    Lipid panel    VITAMIN D 25 Hydroxy (Vit-D Deficiency, Fractures)        Follow up plan: Return in about 1 year (around 01/23/2017), or if symptoms worsen or fail to improve.  Counseling provided  for all of the vaccine components Orders Placed This Encounter  Procedures  . CBC with Differential/Platelet  . CMP14+EGFR  . Lipid panel  . VITAMIN D 25 Hydroxy (Vit-D Deficiency, Fractures)    Caryl Pina, MD [facility] 01/24/2016, 8:39 AM

## 2016-01-25 LAB — CMP14+EGFR
ALT: 22 IU/L (ref 0–44)
AST: 31 IU/L (ref 0–40)
Albumin/Globulin Ratio: 1.6 (ref 1.2–2.2)
Albumin: 4.5 g/dL (ref 3.5–5.5)
Alkaline Phosphatase: 64 IU/L (ref 39–117)
BUN/Creatinine Ratio: 14 (ref 9–20)
BUN: 15 mg/dL (ref 6–24)
Bilirubin Total: 0.7 mg/dL (ref 0.0–1.2)
CALCIUM: 9.6 mg/dL (ref 8.7–10.2)
CO2: 24 mmol/L (ref 18–29)
CREATININE: 1.04 mg/dL (ref 0.76–1.27)
Chloride: 98 mmol/L (ref 96–106)
GFR, EST AFRICAN AMERICAN: 101 mL/min/{1.73_m2} (ref >59)
GFR, EST NON AFRICAN AMERICAN: 88 mL/min/{1.73_m2} (ref >59)
GLUCOSE: 108 mg/dL — AB (ref 65–99)
Globulin, Total: 2.9 g/dL (ref 1.5–4.5)
POTASSIUM: 4.7 mmol/L (ref 3.5–5.2)
Sodium: 141 mmol/L (ref 134–144)
TOTAL PROTEIN: 7.4 g/dL (ref 6.0–8.5)

## 2016-01-25 LAB — CBC WITH DIFFERENTIAL/PLATELET
Basophils Absolute: 0 10*3/uL (ref 0.0–0.2)
Basos: 0 %
EOS (ABSOLUTE): 0.2 10*3/uL (ref 0.0–0.4)
Eos: 4 %
Hematocrit: 46.7 % (ref 37.5–51.0)
Hemoglobin: 15.6 g/dL (ref 12.6–17.7)
IMMATURE GRANS (ABS): 0 10*3/uL (ref 0.0–0.1)
IMMATURE GRANULOCYTES: 0 %
LYMPHS: 38 %
Lymphocytes Absolute: 1.8 10*3/uL (ref 0.7–3.1)
MCH: 28.4 pg (ref 26.6–33.0)
MCHC: 33.4 g/dL (ref 31.5–35.7)
MCV: 85 fL (ref 79–97)
MONOS ABS: 0.3 10*3/uL (ref 0.1–0.9)
Monocytes: 6 %
NEUTROS PCT: 52 %
Neutrophils Absolute: 2.4 10*3/uL (ref 1.4–7.0)
PLATELETS: 252 10*3/uL (ref 150–379)
RBC: 5.49 x10E6/uL (ref 4.14–5.80)
RDW: 13.9 % (ref 12.3–15.4)
WBC: 4.7 10*3/uL (ref 3.4–10.8)

## 2016-01-25 LAB — LIPID PANEL
CHOL/HDL RATIO: 2.8 ratio (ref 0.0–5.0)
Cholesterol, Total: 177 mg/dL (ref 100–199)
HDL: 63 mg/dL (ref >39)
LDL CALC: 97 mg/dL (ref 0–99)
TRIGLYCERIDES: 86 mg/dL (ref 0–149)
VLDL CHOLESTEROL CAL: 17 mg/dL (ref 5–40)

## 2016-01-25 LAB — VITAMIN D 25 HYDROXY (VIT D DEFICIENCY, FRACTURES): Vit D, 25-Hydroxy: 42.1 ng/mL (ref 30.0–100.0)

## 2016-01-27 LAB — HGB A1C W/O EAG: Hgb A1c MFr Bld: 5.6 % (ref 4.8–5.6)

## 2016-01-27 LAB — SPECIMEN STATUS REPORT

## 2016-02-21 DIAGNOSIS — M5126 Other intervertebral disc displacement, lumbar region: Secondary | ICD-10-CM | POA: Diagnosis not present

## 2016-03-06 DIAGNOSIS — M549 Dorsalgia, unspecified: Secondary | ICD-10-CM | POA: Diagnosis not present

## 2016-05-06 DIAGNOSIS — Z008 Encounter for other general examination: Secondary | ICD-10-CM | POA: Diagnosis not present

## 2016-05-06 DIAGNOSIS — Z1389 Encounter for screening for other disorder: Secondary | ICD-10-CM | POA: Diagnosis not present

## 2016-05-06 DIAGNOSIS — R7301 Impaired fasting glucose: Secondary | ICD-10-CM | POA: Diagnosis not present

## 2016-05-06 DIAGNOSIS — Z7189 Other specified counseling: Secondary | ICD-10-CM | POA: Diagnosis not present

## 2016-05-31 DIAGNOSIS — J069 Acute upper respiratory infection, unspecified: Secondary | ICD-10-CM | POA: Diagnosis not present

## 2016-06-04 DIAGNOSIS — J0110 Acute frontal sinusitis, unspecified: Secondary | ICD-10-CM | POA: Diagnosis not present

## 2016-06-04 DIAGNOSIS — R05 Cough: Secondary | ICD-10-CM | POA: Diagnosis not present

## 2016-06-05 ENCOUNTER — Ambulatory Visit (INDEPENDENT_AMBULATORY_CARE_PROVIDER_SITE_OTHER): Payer: BLUE CROSS/BLUE SHIELD | Admitting: Physician Assistant

## 2016-06-05 ENCOUNTER — Encounter: Payer: Self-pay | Admitting: Physician Assistant

## 2016-06-05 ENCOUNTER — Telehealth: Payer: Self-pay | Admitting: *Deleted

## 2016-06-05 VITALS — BP 128/83 | HR 91 | Temp 98.5°F | Ht 71.0 in | Wt 169.2 lb

## 2016-06-05 DIAGNOSIS — B349 Viral infection, unspecified: Secondary | ICD-10-CM

## 2016-06-05 DIAGNOSIS — J209 Acute bronchitis, unspecified: Secondary | ICD-10-CM | POA: Diagnosis not present

## 2016-06-05 DIAGNOSIS — T7840XA Allergy, unspecified, initial encounter: Secondary | ICD-10-CM

## 2016-06-05 DIAGNOSIS — T68XXXA Hypothermia, initial encounter: Secondary | ICD-10-CM

## 2016-06-05 LAB — VERITOR FLU A/B WAIVED
INFLUENZA A: NEGATIVE
Influenza B: NEGATIVE

## 2016-06-05 NOTE — Progress Notes (Signed)
BP 128/83   Pulse 91   Temp 98.5 F (36.9 C) (Oral)   Ht 5\' 11"  (1.803 m)   Wt 169 lb 4 oz (76.8 kg)   BMI 23.61 kg/m    Subjective:    Patient ID: Christopher Maxwell, male    DOB: 27-Aug-1970, 45 y.o.   MRN: [id number]  HPI: Christopher Maxwell is a 45 y.o. male presenting on 06/05/2016 for Cough, congestion, headache (began one week ago, went to clinic at work last Friday and was told to take Sudafed, he did not improve so he called Monday and they recommended he take Mucinex.  He went into the clinic on Tuesday and was given Azithyromycin and took first dose, afterwards he began experiencing extreme fatigue, dizziness and shaking.  )  [name] treated several times with cough and congestion. Is the past day he was given a Z-Pak from his work Engineer, civil (consulting)nurse. He took 1 dose today and shortly after he began having significant weakness, diaphoresis, coldness in his hands and feet. He try to take his temperature several times and never registered and read low. He is very concerned about the sudden reaction to the medication and has come in today. He reports that his congestion has been feeling better.  Past Medical History:  Diagnosis Date  . Plantar fasciitis, bilateral    Relevant past medical, surgical, family and social history reviewed and updated as indicated. Interim medical history since our last visit reviewed. Allergies and medications reviewed and updated. DATA REVIEWED: CHART IN EPIC  Social History   Social History  . Marital status: Single    Spouse name: N/A  . Number of children: N/A  . Years of education: N/A   Occupational History  . Not on file.   Social History Main Topics  . Smoking status: Former Smoker    Types: Cigarettes    Quit date: 04/15/2011  . Smokeless tobacco: Never Used  . Alcohol use 1.2 oz/week    2 Cans of beer per week     Comment: SOCIAL  . Drug use: No  . Sexual activity: Yes   Other Topics Concern  . Not on file   Social History Narrative  . No  narrative on file    Past Surgical History:  Procedure Laterality Date  . VASECTOMY    . WISDOM TOOTH EXTRACTION      Family History  Problem Relation Age of Onset  . Diabetes Father     Review of Systems  Constitutional: Positive for chills, diaphoresis and fever. Negative for appetite change and fatigue.  HENT: Positive for congestion, postnasal drip and sinus pain.   Eyes: Negative.  Negative for pain and visual disturbance.  Respiratory: Positive for chest tightness. Negative for cough, shortness of breath and wheezing.   Cardiovascular: Negative.  Negative for chest pain, palpitations and leg swelling.  Gastrointestinal: Negative.  Negative for abdominal pain, diarrhea, nausea and vomiting.  Endocrine: Negative.   Genitourinary: Negative.   Musculoskeletal: Negative.   Skin: Negative.  Negative for color change and rash.  Neurological: Negative.  Negative for weakness, numbness and headaches.  Psychiatric/Behavioral: Negative.       Medication List       Accurate as of 06/05/16 11:26 PM. Always use your most recent med list.          cholecalciferol 1000 units tablet Commonly known as:  VITAMIN D Take 1,000 Units by mouth daily.          Objective:  BP 128/83   Pulse 91   Temp 98.5 F (36.9 C) (Oral)   Ht 5\' 11"  (1.803 m)   Wt 169 lb 4 oz (76.8 kg)   BMI 23.61 kg/m   Allergies  Allergen Reactions  . Azithromycin Other (See Comments)    Hypothermia, diaphoresis, weakness  . Penicillins     Wt Readings from Last 3 Encounters:  06/05/16 169 lb 4 oz (76.8 kg)  01/24/16 174 lb (78.9 kg)  11/16/15 171 lb 6.4 oz (77.7 kg)    Physical Exam  Results for orders placed or performed in visit on 06/05/16  Veritor Flu A/B Waived  Result Value Ref Range   Influenza A Negative Negative   Influenza B Negative Negative      Assessment & Plan:   1. Viral illness Negative flu test., Reassure resolution of symptoms. - Veritor Flu A/B Waived  2.  Hypothermia, initial encounter Side effect to azithromycin - Veritor Flu A/B Waived  3. Acute bronchitis, unspecified organism - Veritor Flu A/B Waived  4. Allergic reaction, initial encounter   Continue all other maintenance medications as listed above.  Follow up plan: Follow up as needed or call if any worsening of symptoms.  Orders Placed This Encounter  Procedures  . Veritor Flu A/B [facility]Waived    Educational handout given for allergic reaction  Remus LofflerAngel S. Bright Spielmann PA-C [facility] [address]  [place], [place] [address] [phone]   06/05/2016, 11:26 PM

## 2016-06-05 NOTE — Telephone Encounter (Signed)
appt made

## 2016-06-05 NOTE — Patient Instructions (Signed)
Anaphylactic Reaction An anaphylactic reaction (anaphylaxis) is a sudden allergic reaction that is very bad (severe). It also affects more than one part of the body. This condition can be life-threatening. If you have an anaphylactic reaction, you need to get medical help right away. You may need to stay in the hospital. Your doctor may teach you how to use an allergy kit (anaphylaxis kit) and how to give yourself an allergy shot (epinephrine injection). You can give yourself an allergy shot with what is commonly called an auto-injector "pen." Symptoms of an anaphylactic reaction may include:  A stuffy nose (nasal congestion).  Headache.  Tingling in your mouth.  A flushed face.  An itchy, red rash.  Swelling of your eyes, lips, face, or tongue.  Swelling of the back of your mouth and your throat.  Breathing loudly (wheezing).  A hoarse voice.  Itchy, red, swollen areas of skin (hives).  Dizziness or light-headedness.  Passing out (fainting).  Feeling worried or nervous (anxiety).  Feeling confused.  Pain in your belly (abdomen) or chest.  Trouble with breathing, talking, or swallowing.  A tight feeling in your chest or throat.  Fast or uneven heartbeats (palpitations).  Throwing up (vomiting).  Watery poop (diarrhea). Follow these instructions at home: Safety  Always keep an auto-injector pen or your allergy kit with you. These could save your life. Use them as told by your doctor.  Do not drive until your doctor says that it is safe.  Make sure that you, the people who live with you, and your employer know:  How to use your allergy kit.  How to use an auto-injector pen to give you an allergy shot.  If you used your auto-injector pen:  Get more medicine for it right away. This is important in case you have another reaction.  Get help right away.  Wear a bracelet or necklace that says you have an allergy, if your doctor tells you to do this.  Learn the  signs of a very bad allergic reaction.  Work with your doctors to make a plan for what to do if you have a very bad allergic reaction. Being prepared is important. General instructions  Take over-the-counter and prescription medicines only as told by your doctor.  If you have itchy, red, swollen areas of skin or a rash:  Use over-the-counter medicine (antihistamine) as told by your doctor.  Put cold, wet cloths (cold compresses) on your skin.  Take baths or showers in cool water. Avoid hot water.  If you had tests done, it is up to you to get your test results. Ask your doctor when your results will be ready.  Tell any doctors who care for you that you have an allergy.  Keep all follow-up visits as told by your doctor. This is important. How is this prevented?  Avoid things (allergens) that gave you a very bad allergic reaction before.  If you have a food allergy and you go to a restaurant, tell your server about your allergy. If you are not sure if your meal was made with food that you are allergic to, ask your server before you eat it. Contact a doctor if:  You have symptoms of an allergic reaction. You may notice them soon after being around whatever it is that you are allergic to. Symptoms may include:  A rash.  A headache.  Sneezing or a runny nose.  Swelling.  Feeling sick to your stomach.  Watery poop. Get help right away   if:  You had to use your auto-injector pen. You must go to the emergency room even if the medicine seems to be working.  You have any of these:  A tight feeling in your chest or your throat.  Loud breathing.  Trouble with breathing.  Itchy, red, swollen areas of skin.  Red skin or itching all over your body.  Swelling in your lips, tongue, or the back of your throat.  You have throwing up that gets very bad.  You have watery poop that gets very bad.  You pass out or feel like you might pass out. These symptoms may be an  emergency. Do not wait to see if the symptoms will go away. Use your auto-injector pen or allergy kit as you have been told. Get medical help right away. Call your local emergency services (911 in the [place] Do not drive yourself to the hospital.  Summary  An anaphylactic reaction (anaphylaxis) is a sudden allergic reaction that is very bad (severe).  This condition can be life-threatening. If you have an anaphylactic reaction, you need to get medical help right away.  Your doctor may teach you how to use an allergy kit (anaphylaxis kit) and how to give yourself an allergy shot (epinephrine injection) with an auto-injector "pen."  Always keep an auto-injector pen or your allergy kit with you. These could save your life. Use them as told by your doctor.  If you had to use your auto-injector pen, you must go to the emergency room even if the medicine seems to be working. This information is not intended to replace advice given to you by your health care provider. Make sure you discuss any questions you have with your health care provider. Document Released: 12/18/2007 Document Revised: 02/23/2016 Document Reviewed: 02/23/2016 Elsevier Interactive Patient Education  2017 [employer].

## 2016-09-27 DIAGNOSIS — J111 Influenza due to unidentified influenza virus with other respiratory manifestations: Secondary | ICD-10-CM | POA: Diagnosis not present

## 2016-12-23 DIAGNOSIS — Z008 Encounter for other general examination: Secondary | ICD-10-CM | POA: Diagnosis not present

## 2016-12-23 DIAGNOSIS — Z719 Counseling, unspecified: Secondary | ICD-10-CM | POA: Diagnosis not present

## 2016-12-23 DIAGNOSIS — Z1389 Encounter for screening for other disorder: Secondary | ICD-10-CM | POA: Diagnosis not present

## 2016-12-23 DIAGNOSIS — R7301 Impaired fasting glucose: Secondary | ICD-10-CM | POA: Diagnosis not present

## 2016-12-24 ENCOUNTER — Ambulatory Visit (INDEPENDENT_AMBULATORY_CARE_PROVIDER_SITE_OTHER): Payer: BLUE CROSS/BLUE SHIELD | Admitting: Family

## 2016-12-24 ENCOUNTER — Encounter: Payer: Self-pay | Admitting: Family

## 2016-12-24 VITALS — BP 129/89 | HR 77 | Temp 97.0°F | Ht 71.0 in | Wt 178.4 lb

## 2016-12-24 DIAGNOSIS — T7840XA Allergy, unspecified, initial encounter: Secondary | ICD-10-CM

## 2016-12-24 MED ORDER — DIPHENHYDRAMINE HCL 25 MG PO CAPS
25.0000 mg | ORAL_CAPSULE | Freq: Once | ORAL | Status: AC
  Administered 2016-12-24: 25 mg via ORAL

## 2016-12-24 NOTE — Patient Instructions (Signed)
Food Allergy A food allergy is an abnormal reaction to a food (food allergen) by the body's defense system (immune system). Foods that commonly cause allergies are:  Milk.  Seafood.  Eggs.  Nuts.  Wheat.  Soy.  What are the causes? Food allergies happen when the immune system mistakenly sees a food as harmful and releases antibodies to fight it. What are the signs or symptoms? Symptoms may be mild or severe. They usually start minutes after the food is eaten, but they can occur even a few hours later. In people with a severe allergy, symptoms can start within seconds. Mild symptoms  Nasal congestion.  Tingling in the mouth.  An itchy, red rash.  Vomiting.  Diarrhea. Severe symptoms  Swelling of the lips, face, and tongue.  Swelling of the back of the mouth and throat.  Wheezing.  A hoarse voice.  Itchy, red, swollen areas of skin (hives).  Dizziness or light-headedness.  Fainting.  Trouble breathing, speaking, or swallowing.  Chest tightness.  Rapid heartbeat. How is this diagnosed? A diagnosis is made with a physical exam, medical and family history, and one or more of the following:  Skin tests.  Blood tests.  A food diary.  The results of an elimination diet. The elimination diet involves removing foods from your diet and then adding them back in, one at a time.  How is this treated? There is no cure for allergies. An allergic reaction can be treated with medicines, such as:  Antihistamines.  Steroids.  Respiratory inhalers.  Epinephrine.  Severe symptoms can be a sign of a life-threatening reaction called anaphylaxis, and they require immediate treatment. Severe reactions usually need to be treated at a hospital. People who have had a severe reaction may be prescribed rescue medicines to take if they are accidentally exposed to an allergen. Follow these instructions at home: General instructions  Avoid the foods that you are allergic  to.  Read food labels before you eat packaged items. Look for ingredients you are allergic to.  When you are at a restaurant, tell your server that you have an allergy. If you are unsure of whether a meal has an ingredient that you are allergic to, ask your server.  Take medicines only as directed by your health care provider. Do not drive until the medicine has worn off, unless your health care provider gives you approval.  Inform all health care providers that you have a food allergy.  Contact your health care provider if you want to be tested for an allergy. If you have had an anaphylactic reaction before, you should never test yourself for an allergy without your health care provider's approval. Instructions for People with Severe Allergies  Wear a medical alert bracelet or necklace that describes your allergy.  Carry your anaphylaxis kit or an epinephrine injection with you at all times. Use them as directed by your health care provider.  Make sure that you, your family members, and your employer know: ? How to use an anaphylaxis kit. ? How to give an epinephrine injection.  Replace your epinephrine immediately after use, in case you have another reaction.  Seek medical care even after you take epinephrine. This is important because epinephrine can be followed by a delayed, life-threatening reaction. Instructions for People with a Potential Allergy  Follow the elimination diet as directed by your health care provider.  Keep a food diary as directed by your health care provider. Every day, write down: ? What you eat and   drink and when. ? What symptoms you have and when. Contact a health care provider if:  Your symptoms have not gone away within 2 days.  Your symptoms get worse.  You develop new symptoms. Get help right away if:  You use epinephrine.  You are having a severe allergic reaction. Symptoms of a severe reaction include: ? Swelling of the lips, face, and  tongue. ? Swelling of the back of the mouth and throat. ? Wheezing. ? A hoarse voice. ? Hives. ? Dizziness or light-headedness. ? Fainting. ? Trouble breathing, speaking, or swallowing. ? Chest tightness. ? Rapid heartbeat. This information is not intended to replace advice given to you by your health care provider. Make sure you discuss any questions you have with your health care provider. Document Released: 06/28/2000 Document Revised: 11/28/2015 Document Reviewed: 04/12/2014 Elsevier Interactive Patient Education  2018 [employer]  

## 2016-12-24 NOTE — Progress Notes (Signed)
   Subjective:    Patient ID: Christopher Maxwell, male    DOB: [birth year], 46 y.o.   MRN: [id number]  HPI Pt presents to the office today with a possible allergic reaction. PT states he ate some cranberries this afternoon around 3:30 pm and has never ate cranberries before. PT states he feels cold, clammy, "does feel right", throat scratchy. Pt states he has had previous allergic reaction and feels the same way. PT denies any recent tick bite.     Review of Systems  Constitutional: Positive for diaphoresis.  All other systems reviewed and are negative.      Objective:   Physical Exam  Constitutional: He is oriented to person, place, and time. He appears well-developed and well-nourished. No distress.  HENT:  Head: Normocephalic.  Right Ear: External ear normal.  Left Ear: External ear normal.  Nose: Nose normal.  Mouth/Throat: Oropharynx is clear and moist.  Eyes: Pupils are equal, round, and reactive to light. Right eye exhibits no discharge. Left eye exhibits no discharge.  Neck: Normal range of motion. Neck supple. No thyromegaly present.  Cardiovascular: Normal rate, regular rhythm, normal heart sounds and intact distal pulses.   No murmur heard. Pulmonary/Chest: Effort normal and breath sounds normal. No respiratory distress. He has no wheezes.  Abdominal: Soft. Bowel sounds are normal. He exhibits no distension. There is no tenderness.  Musculoskeletal: Normal range of motion. He exhibits no edema or tenderness.  Neurological: He is alert and oriented to person, place, and time.  Skin: Skin is warm and dry. No rash noted. No erythema.  Psychiatric: He has a normal mood and affect. His behavior is normal. Judgment and thought content normal.  Vitals reviewed.     BP 129/89   Pulse 77   Temp 97 F (36.1 C) (Oral)   Ht 5\' 11"  (1.803 m)   Wt 178 lb 6.4 oz (80.9 kg)   SpO2 98%   BMI 24.88 kg/m      Assessment & Plan:  1. Allergic reaction, initial encounter -  diphenhydrAMINE (BENADRYL) capsule 25 mg; Take 1 capsule (25 mg total) by mouth once.  Discussed this is worrisome for alpha-gal. If this occurs again after ingestion beef, pork, or lamb needs to come in and be tested. Pt does not want lab work today Avoid cranberries Benadryl as needed RTO prn   Jannifer Rodneyhristy Hawks, FNP

## 2017-01-08 ENCOUNTER — Encounter: Payer: Self-pay | Admitting: *Deleted

## 2017-01-13 ENCOUNTER — Ambulatory Visit (INDEPENDENT_AMBULATORY_CARE_PROVIDER_SITE_OTHER): Payer: BLUE CROSS/BLUE SHIELD | Admitting: Family Medicine

## 2017-01-13 ENCOUNTER — Encounter: Payer: Self-pay | Admitting: Family Medicine

## 2017-01-13 VITALS — BP 120/84 | HR 78 | Temp 97.2°F | Ht 71.0 in | Wt 174.0 lb

## 2017-01-13 DIAGNOSIS — R55 Syncope and collapse: Secondary | ICD-10-CM | POA: Diagnosis not present

## 2017-01-13 DIAGNOSIS — R002 Palpitations: Secondary | ICD-10-CM | POA: Diagnosis not present

## 2017-01-13 NOTE — Progress Notes (Signed)
 Subjective:  Patient ID: Christopher Maxwell, male    DOB: 05/05/1971  Age: 46 y.o. MRN: [id number]  CC: Weakness (pt here today c/o feeling "off" and just not feeling right. Pt was here about  2 weeks ago with similar symptoms and thought it was an allergic reaction to cranberries and thought it was that but now he isn't so sure.)   HPI Christopher Maxwell presents for Symptoms similar to his last visit here on June 12. He says that he was sitting at his computer this morning just doing his regular routine when he became sweaty breaking out in chills feeling cold and shaky he did not have any nausea nor did he have shortness of breath or chest pain. He went to see the nurse at his job and his blood sugar was tested at 127. He says she told him his blood pressure was normal. This lasted for about a half an hour and his symptoms were nearly gone when she checked him. 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 He was exposed to the heat. He says he's been keeping himself well hydrated with water. He came in and rested watching a movie after about an hour she took a muscle relaxer and that's when symptoms occurred last night again lasting several minutes to possibly a half an hour. Ms. Hall's note of June 12 is reviewed. There are no similarities this time to that with regard to circumstances even though symptoms were similar primarily involving feeling cold and clammy and breaking out in a sweat. There is no known exposure to tick bite or mosquito bite.  Depression screen PHQ 2/9 01/13/2017 12/24/2016 06/05/2016  Decreased Interest 0 0 0  Down, Depressed, Hopeless 0 0 0  PHQ - 2 Score 0 0 0    History Christopher Maxwell has a past medical history of Plantar fasciitis, bilateral.   He has a past surgical history that includes Wisdom tooth extraction and Vasectomy.   His family history includes Diabetes in his father.He reports that he quit  smoking about 5 years ago. His smoking use included Cigarettes. He has never used smokeless tobacco. He reports that he drinks about 1.2 oz of alcohol per week . He reports that he does not use drugs.    ROS Review of Systems  Constitutional: Positive for chills, diaphoresis and fatigue. Negative for fever and unexpected weight change.  HENT: Negative for congestion, hearing loss, rhinorrhea and sore throat.   Eyes: Negative for visual disturbance.  Respiratory: Negative for cough and shortness of breath.   Cardiovascular: Negative for chest pain.  Gastrointestinal: Negative for abdominal pain, constipation, diarrhea and nausea.  Genitourinary: Negative for dysuria and flank pain.  Musculoskeletal: Negative for arthralgias and joint swelling.  Skin: Negative for rash.  Neurological: Positive for dizziness (felt lightheaded and perhaps slight). Negative for headaches.  Psychiatric/Behavioral: Negative for dysphoric mood and sleep disturbance.    Objective:  BP 120/84   Pulse 78   Temp 97.2 F (36.2 C) (Oral)   Ht 5' 11" (1.803 m)   Wt 174 lb (78.9 kg)   BMI 24.27 kg/m   BP Readings from Last 3 Encounters:  01/13/17 120/84  12/24/16 129/89  06/05/16 128/83    Wt Readings from Last 3 Encounters:  01/13/17 174 lb (78.9 kg)  12/24/16 178 lb 6.4 oz (80.9 kg)  06/05/16 169 lb 4 oz (76.8 kg)     Physical Exam    Constitutional: He is oriented to person, place, and time. He appears well-developed and well-nourished. No distress.  HENT:  Head: Normocephalic and atraumatic.  Right Ear: External ear normal.  Left Ear: External ear normal.  Nose: Nose normal.  Mouth/Throat: Oropharynx is clear and moist.  Eyes: Conjunctivae and EOM are normal. Pupils are equal, round, and reactive to light.  Neck: Normal range of motion. Neck supple. No thyromegaly present.  Cardiovascular: Normal rate, regular rhythm and normal heart sounds.   No murmur heard. Pulmonary/Chest: Effort normal  and breath sounds normal. No respiratory distress. He has no wheezes. He has no rales.  Abdominal: Soft. Bowel sounds are normal. He exhibits no distension. There is no tenderness.  Lymphadenopathy:    He has no cervical adenopathy.  Neurological: He is alert and oriented to person, place, and time. He has normal reflexes.  Skin: Skin is warm and dry.  Psychiatric: His behavior is normal. Judgment and thought content normal. His mood appears anxious.      Assessment & Plan:   Nyshawn was seen today for weakness.  Diagnoses and all orders for this visit:  Near syncope -     CBC with Differential/Platelet -     CMP14+EGFR -     Lyme Ab/Western Blot Reflex -     Rocky mtn spotted fvr abs pnl(IgG+IgM) -     Alpha-Gal Panel -     TSH -     EKG 12-Lead  Palpitation -     EKG 12-Lead   EKG shows no acute changes.    I am having Mr. Zuercher maintain his cholecalciferol.  Allergies as of 01/13/2017      Reactions   Azithromycin Other (See Comments)   Hypothermia, diaphoresis, weakness   Penicillins       Medication List       Accurate as of 01/13/17  2:35 PM. Always use your most recent med list.          cholecalciferol 1000 units tablet Commonly known as:  VITAMIN D Take 1,000 Units by mouth daily.        Follow-up: Return in about 2 weeks (around 01/27/2017).   , M.D. 

## 2017-01-16 LAB — CBC WITH DIFFERENTIAL/PLATELET
BASOS ABS: 0 10*3/uL (ref 0.0–0.2)
Basos: 1 %
EOS (ABSOLUTE): 0.2 10*3/uL (ref 0.0–0.4)
Eos: 3 %
HEMOGLOBIN: 15.2 g/dL (ref 13.0–17.7)
Hematocrit: 44.6 % (ref 37.5–51.0)
IMMATURE GRANS (ABS): 0 10*3/uL (ref 0.0–0.1)
IMMATURE GRANULOCYTES: 0 %
LYMPHS: 35 %
Lymphocytes Absolute: 2 10*3/uL (ref 0.7–3.1)
MCH: 28.5 pg (ref 26.6–33.0)
MCHC: 34.1 g/dL (ref 31.5–35.7)
MCV: 84 fL (ref 79–97)
MONOCYTES: 7 %
Monocytes Absolute: 0.4 10*3/uL (ref 0.1–0.9)
Neutrophils Absolute: 3 10*3/uL (ref 1.4–7.0)
Neutrophils: 54 %
Platelets: 256 10*3/uL (ref 150–379)
RBC: 5.34 x10E6/uL (ref 4.14–5.80)
RDW: 13.7 % (ref 12.3–15.4)
WBC: 5.7 10*3/uL (ref 3.4–10.8)

## 2017-01-16 LAB — ALPHA-GAL PANEL
Alpha Gal IgE*: <0.1 kU/L (ref <0.35)
Class Interpretation: 0
Class Interpretation: 0
LAMB CLASS INTERPRETATION: 0
Lamb/Mutton (Ovis spp) IgE: <0.1 kU/L (ref <0.35)
Pork (Sus spp) IgE: <0.1 kU/L (ref <0.35)

## 2017-01-16 LAB — CMP14+EGFR
ALBUMIN: 4.7 g/dL (ref 3.5–5.5)
ALK PHOS: 66 IU/L (ref 39–117)
ALT: 22 IU/L (ref 0–44)
AST: 16 IU/L (ref 0–40)
Albumin/Globulin Ratio: 1.7 (ref 1.2–2.2)
BUN / CREAT RATIO: 16 (ref 9–20)
BUN: 14 mg/dL (ref 6–24)
Bilirubin Total: 0.4 mg/dL (ref 0.0–1.2)
CALCIUM: 9.5 mg/dL (ref 8.7–10.2)
CO2: 23 mmol/L (ref 20–29)
CREATININE: 0.9 mg/dL (ref 0.76–1.27)
Chloride: 100 mmol/L (ref 96–106)
GFR calc non Af Amer: 104 mL/min/{1.73_m2} (ref >59)
GFR, EST AFRICAN AMERICAN: 120 mL/min/{1.73_m2} (ref >59)
GLUCOSE: 91 mg/dL (ref 65–99)
Globulin, Total: 2.7 g/dL (ref 1.5–4.5)
Potassium: 4.6 mmol/L (ref 3.5–5.2)
Sodium: 140 mmol/L (ref 134–144)
Total Protein: 7.4 g/dL (ref 6.0–8.5)

## 2017-01-16 LAB — TSH: TSH: 2.14 u[IU]/mL (ref 0.450–4.500)

## 2017-01-16 LAB — LYME AB/WESTERN BLOT REFLEX: LYME DISEASE AB, QUANT, IGM: <0.8 index (ref 0.00–0.79)

## 2017-01-16 LAB — ROCKY MTN SPOTTED FVR ABS PNL(IGG+IGM)
RMSF IGM: 0.19 {index} (ref 0.00–0.89)
RMSF IgG: NEGATIVE

## 2017-01-27 ENCOUNTER — Encounter: Payer: BLUE CROSS/BLUE SHIELD | Admitting: Family Medicine

## 2017-01-29 ENCOUNTER — Encounter: Payer: BLUE CROSS/BLUE SHIELD | Admitting: Family Medicine

## 2017-02-11 ENCOUNTER — Ambulatory Visit (INDEPENDENT_AMBULATORY_CARE_PROVIDER_SITE_OTHER): Payer: BLUE CROSS/BLUE SHIELD | Admitting: Family Medicine

## 2017-02-11 ENCOUNTER — Encounter: Payer: Self-pay | Admitting: Family Medicine

## 2017-02-11 VITALS — BP 112/84 | HR 76 | Temp 97.6°F | Ht 71.0 in | Wt 172.0 lb

## 2017-02-11 DIAGNOSIS — Z0000 Encounter for general adult medical examination without abnormal findings: Secondary | ICD-10-CM | POA: Diagnosis not present

## 2017-02-11 DIAGNOSIS — B360 Pityriasis versicolor: Secondary | ICD-10-CM

## 2017-02-11 DIAGNOSIS — K429 Umbilical hernia without obstruction or gangrene: Secondary | ICD-10-CM

## 2017-02-11 DIAGNOSIS — M41115 Juvenile idiopathic scoliosis, thoracolumbar region: Secondary | ICD-10-CM

## 2017-02-11 MED ORDER — PREDNISONE 10 MG PO TABS: ORAL_TABLET | ORAL | 0 refills | Status: DC

## 2017-02-11 MED ORDER — ECONAZOLE NITRATE 1 % EX CREA: TOPICAL_CREAM | Freq: Every day | CUTANEOUS | 0 refills | Status: DC

## 2017-02-11 NOTE — Progress Notes (Signed)
Subjective:  Patient ID: Christopher Maxwell, male    DOB: May 28, 1971  Age: 46 y.o. MRN: [id number]  CC: Annual Exam (pt here today for CPE and also has c/o back pain)   HPI Christopher Maxwell presents for Low back pain starting 5 days ago. Been working its way up the midline. He has a history of long-term scoliosis. He also is concerned about some rash on his face. He used a fungal cream about a year ago that did not help.  Depression screen Christopher Maxwell 2/9 02/11/2017 01/13/2017 12/24/2016  Decreased Interest 0 0 0  Down, Depressed, Hopeless 0 0 0  PHQ - 2 Score 0 0 0    History Christopher Maxwell has a past medical history of Plantar fasciitis, bilateral.   He has a past surgical history that includes Wisdom tooth extraction and Vasectomy.   His family history includes Diabetes in his father.He reports that he quit smoking about 5 years ago. His smoking use included Cigarettes. He has never used smokeless tobacco. He reports that he drinks about 1.2 oz of alcohol per week . He reports that he does not use drugs.    ROS Review of Systems  Constitutional: Negative for activity change, appetite change, chills, diaphoresis, fatigue, fever and unexpected weight change.  HENT: Negative for congestion, ear pain, hearing loss, postnasal drip, rhinorrhea, sore throat, tinnitus and trouble swallowing.   Eyes: Negative for photophobia, pain, discharge and redness.  Respiratory: Negative for apnea, cough, choking, chest tightness, shortness of breath, wheezing and stridor.   Cardiovascular: Negative for chest pain, palpitations and leg swelling.  Gastrointestinal: Negative for abdominal distention, abdominal pain, blood in stool, constipation, diarrhea, nausea and vomiting.  Endocrine: Negative for cold intolerance, heat intolerance, polydipsia, polyphagia and polyuria.  Genitourinary: Negative for difficulty urinating, dysuria, enuresis, flank pain, frequency, genital sores, hematuria and urgency.  Musculoskeletal:  Negative for arthralgias and joint swelling.  Skin: Negative for color change, rash and wound.  Allergic/Immunologic: Negative for immunocompromised state.  Neurological: Negative for dizziness, tremors, seizures, syncope, facial asymmetry, speech difficulty, weakness, light-headedness, numbness and headaches.  Hematological: Does not bruise/bleed easily.  Psychiatric/Behavioral: Negative for agitation, behavioral problems, confusion, decreased concentration, dysphoric mood, hallucinations, sleep disturbance and suicidal ideas. The patient is not nervous/anxious and is not hyperactive.     Objective:  BP 112/84   Pulse 76   Temp 97.6 F (36.4 C) (Oral)   Ht '5\' 11"'$  (1.803 m)   Wt 172 lb (78 kg)   BMI 23.99 kg/m   BP Readings from Last 3 Encounters:  02/11/17 112/84  01/13/17 120/84  12/24/16 129/89    Wt Readings from Last 3 Encounters:  02/11/17 172 lb (78 kg)  01/13/17 174 lb (78.9 kg)  12/24/16 178 lb 6.4 oz (80.9 kg)     Physical Exam  Constitutional: He is oriented to person, place, and time. He appears well-developed and well-nourished.  HENT:  Head: Normocephalic and atraumatic.  Mouth/Throat: Oropharynx is clear and moist.  Eyes: Pupils are equal, round, and reactive to light. EOM are normal.  Neck: Normal range of motion. No tracheal deviation present. No thyromegaly present.  Cardiovascular: Normal rate, regular rhythm and normal heart sounds.  Exam reveals no gallop and no friction rub.   No murmur heard. Pulmonary/Chest: Breath sounds normal. He has no wheezes. He has no rales.  Abdominal: Soft. He exhibits no mass. There is no tenderness.  Genitourinary: Rectum normal, prostate normal and penis normal. No penile tenderness.  Musculoskeletal: Normal range of motion.  He exhibits no edema.  Marked scoliosis which is convexed to the right in the thoracolumbar region with deformity of the wing of the scapula bilaterally  Neurological: He is alert and oriented to  person, place, and time. He has normal reflexes. No cranial nerve deficit. He exhibits normal muscle tone.  Skin: Skin is warm and dry. Rash (brownish slightly hyperpigmented eruption at the angle of the mandible on the left) noted.  Psychiatric: He has a normal mood and affect.      Assessment & Plan:   Christopher Maxwell was seen today for annual exam.  Diagnoses and all orders for this visit:  Well adult exam -     CBC with Differential/Platelet -     CMP14+EGFR -     Lipid panel -     PSA Total (Reflex To Free) -     Urinalysis  Tinea versicolor -     Ambulatory referral to Dermatology  Juvenile idiopathic scoliosis of thoracolumbar region -     Ambulatory referral to Orthopedics  Umbilical hernia without obstruction and without gangrene  Other orders -     econazole nitrate 1 % cream; Apply topically daily. -     predniSONE (DELTASONE) 10 MG tablet; Take 5 daily for 3 days followed by 4,3,2 and 1 for 3 days each.       I am having Christopher Maxwell start on econazole nitrate and predniSONE. I am also having him maintain his cholecalciferol and metaxalone.  Allergies as of 02/11/2017      Reactions   Azithromycin Other (See Comments)   Hypothermia, diaphoresis, weakness   Penicillins       Medication List       Accurate as of 02/11/17 10:29 AM. Always use your most recent med list.          cholecalciferol 1000 units tablet Commonly known as:  VITAMIN D Take 1,000 Units by mouth daily.   econazole nitrate 1 % cream Apply topically daily.   metaxalone 800 MG tablet Commonly known as:  SKELAXIN Take 800 mg by mouth 3 (three) times daily.   predniSONE 10 MG tablet Commonly known as:  DELTASONE Take 5 daily for 3 days followed by 4,3,2 and 1 for 3 days each.        Follow-up: Return in about 1 year (around 02/11/2018).  Christopher Maxwell, M.D.

## 2017-02-12 LAB — CMP14+EGFR
ALBUMIN: 4.7 g/dL (ref 3.5–5.5)
ALK PHOS: 69 IU/L (ref 39–117)
ALT: 18 IU/L (ref 0–44)
AST: 16 IU/L (ref 0–40)
Albumin/Globulin Ratio: 1.7 (ref 1.2–2.2)
BUN / CREAT RATIO: 14 (ref 9–20)
BUN: 15 mg/dL (ref 6–24)
Bilirubin Total: 0.6 mg/dL (ref 0.0–1.2)
CO2: 23 mmol/L (ref 20–29)
CREATININE: 1.1 mg/dL (ref 0.76–1.27)
Calcium: 9.6 mg/dL (ref 8.7–10.2)
Chloride: 99 mmol/L (ref 96–106)
GFR, EST AFRICAN AMERICAN: 94 mL/min/{1.73_m2} (ref >59)
GFR, EST NON AFRICAN AMERICAN: 81 mL/min/{1.73_m2} (ref >59)
GLOBULIN, TOTAL: 2.7 g/dL (ref 1.5–4.5)
Glucose: 99 mg/dL (ref 65–99)
Potassium: 4.4 mmol/L (ref 3.5–5.2)
SODIUM: 139 mmol/L (ref 134–144)
Total Protein: 7.4 g/dL (ref 6.0–8.5)

## 2017-02-12 LAB — CBC WITH DIFFERENTIAL/PLATELET
Basophils Absolute: 0 10*3/uL (ref 0.0–0.2)
Basos: 1 %
EOS (ABSOLUTE): 0.3 10*3/uL (ref 0.0–0.4)
EOS: 6 %
HEMATOCRIT: 46.3 % (ref 37.5–51.0)
HEMOGLOBIN: 16.1 g/dL (ref 13.0–17.7)
Immature Grans (Abs): 0 10*3/uL (ref 0.0–0.1)
Immature Granulocytes: 0 %
LYMPHS ABS: 1.8 10*3/uL (ref 0.7–3.1)
Lymphs: 35 %
MCH: 29.1 pg (ref 26.6–33.0)
MCHC: 34.8 g/dL (ref 31.5–35.7)
MCV: 84 fL (ref 79–97)
MONOCYTES: 7 %
Monocytes Absolute: 0.3 10*3/uL (ref 0.1–0.9)
Neutrophils Absolute: 2.7 10*3/uL (ref 1.4–7.0)
Neutrophils: 51 %
Platelets: 245 10*3/uL (ref 150–379)
RBC: 5.53 x10E6/uL (ref 4.14–5.80)
RDW: 13.8 % (ref 12.3–15.4)
WBC: 5.2 10*3/uL (ref 3.4–10.8)

## 2017-02-12 LAB — LIPID PANEL
CHOL/HDL RATIO: 2.8 ratio (ref 0.0–5.0)
Cholesterol, Total: 159 mg/dL (ref 100–199)
HDL: 57 mg/dL (ref >39)
LDL CALC: 86 mg/dL (ref 0–99)
Triglycerides: 82 mg/dL (ref 0–149)
VLDL Cholesterol Cal: 16 mg/dL (ref 5–40)

## 2017-02-12 LAB — PSA TOTAL (REFLEX TO FREE): PROSTATE SPECIFIC AG, SERUM: 0.8 ng/mL (ref 0.0–4.0)

## 2017-03-05 DIAGNOSIS — M4185 Other forms of scoliosis, thoracolumbar region: Secondary | ICD-10-CM | POA: Diagnosis not present

## 2017-03-05 DIAGNOSIS — M2578 Osteophyte, vertebrae: Secondary | ICD-10-CM | POA: Diagnosis not present

## 2017-03-05 DIAGNOSIS — M4312 Spondylolisthesis, cervical region: Secondary | ICD-10-CM | POA: Diagnosis not present

## 2017-03-05 DIAGNOSIS — M4135 Thoracogenic scoliosis, thoracolumbar region: Secondary | ICD-10-CM | POA: Diagnosis not present

## 2017-03-05 DIAGNOSIS — M50322 Other cervical disc degeneration at C5-C6 level: Secondary | ICD-10-CM | POA: Diagnosis not present

## 2017-03-05 DIAGNOSIS — M50323 Other cervical disc degeneration at C6-C7 level: Secondary | ICD-10-CM | POA: Diagnosis not present

## 2017-03-12 DIAGNOSIS — D485 Neoplasm of uncertain behavior of skin: Secondary | ICD-10-CM | POA: Diagnosis not present

## 2017-03-12 DIAGNOSIS — L819 Disorder of pigmentation, unspecified: Secondary | ICD-10-CM | POA: Diagnosis not present

## 2017-03-12 DIAGNOSIS — L919 Hypertrophic disorder of the skin, unspecified: Secondary | ICD-10-CM | POA: Diagnosis not present

## 2017-04-29 ENCOUNTER — Encounter: Payer: Self-pay | Admitting: Physician Assistant

## 2017-04-29 ENCOUNTER — Ambulatory Visit (INDEPENDENT_AMBULATORY_CARE_PROVIDER_SITE_OTHER): Payer: BLUE CROSS/BLUE SHIELD | Admitting: Physician Assistant

## 2017-04-29 VITALS — BP 134/89 | HR 88 | Temp 98.1°F | Ht 71.0 in | Wt 172.6 lb

## 2017-04-29 DIAGNOSIS — R52 Pain, unspecified: Secondary | ICD-10-CM

## 2017-04-29 DIAGNOSIS — J020 Streptococcal pharyngitis: Secondary | ICD-10-CM

## 2017-04-29 DIAGNOSIS — B349 Viral infection, unspecified: Secondary | ICD-10-CM

## 2017-04-29 DIAGNOSIS — R509 Fever, unspecified: Secondary | ICD-10-CM

## 2017-04-29 LAB — RAPID STREP SCREEN (MED CTR MEBANE ONLY): STREP GP A AG, IA W/REFLEX: POSITIVE — AB

## 2017-04-29 LAB — VERITOR FLU A/B WAIVED
Influenza A: NEGATIVE
Influenza B: NEGATIVE

## 2017-04-29 MED ORDER — DOXYCYCLINE HYCLATE 100 MG PO TABS: 100.0000 mg | ORAL_TABLET | Freq: Two times a day (BID) | ORAL | 0 refills | Status: DC

## 2017-04-29 NOTE — Patient Instructions (Signed)
In a few days you may receive a survey in the mail or online from [employer] regarding your visit with us today. Please take a moment to fill this out. Your feedback is very important to our whole office. It can help us better understand your needs as well as improve your experience and satisfaction. Thank you for taking your time to complete it. We care about you.  Emonni Depasquale, PA-C  

## 2017-04-29 NOTE — Progress Notes (Signed)
BP 134/89   Pulse 88   Temp 98.1 F (36.7 C) (Oral)   Ht  (1.803 m)   Wt 172 lb 9.6 oz (78.3 kg)   BMI 24.07 kg/m    Subjective:    Patient ID: Christopher Maxwell, male    DOB: Sep 21, 1970, 46 y.o.   MRN: [id number]  HPI: Christopher Maxwell is a 46 y.o. male presenting on 04/29/2017 for Sore Throat and Generalized Body Aches  Greater than 2 days duration of congestion that is yellow in color. He denies any blood. He has felt cold. He has not run a fever. He does have a very bad sore throat. He has tried some Zyrtec without any relief.  Relevant past medical, surgical, family and social history reviewed and updated as indicated. Allergies and medications reviewed and updated.  Past Medical History:  Diagnosis Date  . Plantar fasciitis, bilateral     Past Surgical History:  Procedure Laterality Date  . VASECTOMY    . WISDOM TOOTH EXTRACTION      Review of Systems  Constitutional: Positive for fatigue. Negative for appetite change and fever.  HENT: Positive for congestion, sinus pressure and sore throat.   Eyes: Negative.  Negative for pain and visual disturbance.  Respiratory: Negative for cough, chest tightness, shortness of breath and wheezing.   Cardiovascular: Negative.  Negative for chest pain, palpitations and leg swelling.  Gastrointestinal: Negative.  Negative for abdominal pain, diarrhea, nausea and vomiting.  Endocrine: Negative.   Genitourinary: Negative.   Musculoskeletal: Positive for back pain and myalgias.  Skin: Negative.  Negative for color change and rash.  Neurological: Positive for headaches. Negative for weakness and numbness.  Psychiatric/Behavioral: Negative.     Allergies as of 04/29/2017      Reactions   Azithromycin Other (See Comments)   Hypothermia, diaphoresis, weakness   Penicillins       Medication List       Accurate as of 04/29/17 11:59 PM. Always use your most recent med list.          cholecalciferol 1000 units  tablet Commonly known as:  VITAMIN D Take 1,000 Units by mouth daily.   doxycycline 100 MG tablet Commonly known as:  VIBRA-TABS Take 1 tablet (100 mg total) by mouth 2 (two) times daily. 1 po bid   metaxalone 800 MG tablet Commonly known as:  SKELAXIN Take 800 mg by mouth 3 (three) times daily.          Objective:    BP 134/89   Pulse 88   Temp 98.1 F (36.7 C) (Oral)   Ht  (1.803 m)   Wt 172 lb 9.6 oz (78.3 kg)   BMI 24.07 kg/m   Allergies  Allergen Reactions  . Azithromycin Other (See Comments)    Hypothermia, diaphoresis, weakness  . Penicillins     Physical Exam  Constitutional: He is oriented to person, place, and time. He appears well-developed and well-nourished.  HENT:  Head: Normocephalic and atraumatic.  Right Ear: Tympanic membrane and external ear normal. No middle ear effusion.  Left Ear: Tympanic membrane and external ear normal.  No middle ear effusion.  Nose: Rhinorrhea present. No mucosal edema. Right sinus exhibits no maxillary sinus tenderness. Left sinus exhibits no maxillary sinus tenderness.  Mouth/Throat: Uvula is midline. Oropharyngeal exudate and posterior oropharyngeal erythema present.  Eyes: Pupils are equal, round, and reactive to light. Conjunctivae and EOM are normal. Right eye exhibits no discharge. Left eye exhibits no discharge.  Neck: Normal range of motion.  Cardiovascular: Normal rate, regular rhythm and normal heart sounds.   Pulmonary/Chest: Effort normal and breath sounds normal. No respiratory distress. He has no wheezes.  Abdominal: Soft.  Lymphadenopathy:    He has no cervical adenopathy.  Neurological: He is alert and oriented to person, place, and time.  Skin: Skin is warm and dry.  Psychiatric: He has a normal mood and affect.    Results for orders placed or performed in visit on 04/29/17  Rapid strep screen (not at [facility])  Result Value Ref Range   Strep Gp A Ag, IA W/Reflex Positive (A) Negative  Veritor Flu  A/B Waived  Result Value Ref Range   Influenza A Negative Negative   Influenza B Negative Negative      Assessment & Plan:   1. Fever and chills - Rapid Strep A - Veritor Flu A/B Waived - Rapid strep screen (not at [facility])  2. Viral illness  3. Body aches - Rapid strep screen (not at [facility])  4. Strep pharyngitis - doxycycline (VIBRA-TABS) 100 MG tablet; Take 1 tablet (100 mg total) by mouth 2 (two) times daily. 1 po bid  Dispense: 20 tablet; Refill: 0    Current Outpatient Prescriptions:  .  cholecalciferol (VITAMIN D) 1000 units tablet, Take 1,000 Units by mouth daily., Disp: , Rfl:  .  metaxalone (SKELAXIN) 800 MG tablet, Take 800 mg by mouth 3 (three) times daily., Disp: , Rfl:  .  doxycycline (VIBRA-TABS) 100 MG tablet, Take 1 tablet (100 mg total) by mouth 2 (two) times daily. 1 po bid, Disp: 20 tablet, Rfl: 0 Continue all other maintenance medications as listed above.  Follow up plan: Return if symptoms worsen or fail to improve.  Educational handout given for survey  Remus Loffler PA-C [facility] [address]  [place], [place] [address] [phone]   04/30/2017, 9:11 AM

## 2017-05-01 ENCOUNTER — Encounter: Payer: Self-pay | Admitting: Nurse Practitioner

## 2017-05-01 ENCOUNTER — Telehealth: Payer: Self-pay | Admitting: Family Medicine

## 2017-05-01 ENCOUNTER — Ambulatory Visit (INDEPENDENT_AMBULATORY_CARE_PROVIDER_SITE_OTHER): Payer: BLUE CROSS/BLUE SHIELD | Admitting: Nurse Practitioner

## 2017-05-01 VITALS — BP 132/92 | HR 87 | Temp 97.0°F | Ht 71.0 in | Wt 172.0 lb

## 2017-05-01 DIAGNOSIS — R6889 Other general symptoms and signs: Secondary | ICD-10-CM

## 2017-05-01 NOTE — Telephone Encounter (Signed)
Spoke with pt and he has aches and chills but denies fever. Advised pt to continue taking antibiotic and to take tylenol or Ibuprofen for aches and if he doesn't improve or worsens to call back for appt. Pt voiced understanding.

## 2017-05-01 NOTE — Progress Notes (Signed)
   Subjective:    Patient ID: Christopher CampbellJonathan Sagen, male    DOB: [birth year], 46 y.o.   MRN: [id number]  HPI Patient in the office with c/o body aches all over and decreasing temperature (as low as 95.8 oral this morning).  Patient diagnosed with strep throat on 04/29/17 and he has been taking doxycycline for 2 days.  States he is no longer having a sore throat or runny nose, but now is having body aches.     Review of Systems  Constitutional: Positive for chills, diaphoresis (hands and feet) and fatigue (x 2 days). Negative for fever.  HENT: Negative for rhinorrhea and sore throat.   Musculoskeletal: Positive for myalgias (generalized achiness < 24 hrs). Negative for neck pain and neck stiffness.  Neurological: Positive for dizziness (intermittent) and light-headedness (intermittent).  All other systems reviewed and are negative.      Objective:   Physical Exam  Constitutional: He is oriented to person, place, and time. He appears well-developed and well-nourished. No distress.  Eyes: Pupils are equal, round, and reactive to light.  Neck: Normal range of motion. Neck supple.  Cardiovascular: Normal rate, regular rhythm and normal heart sounds.   No murmur heard. Pulmonary/Chest: Effort normal and breath sounds normal. No respiratory distress. He has no wheezes.  Lymphadenopathy:    He has no cervical adenopathy.  Neurological: He is alert and oriented to person, place, and time.  Skin: Skin is warm and dry.  Psychiatric: He has a normal mood and affect. His behavior is normal.   BP (!) 132/92   Pulse 87   Temp (!) 97 F (36.1 C) (Oral)   Ht 5\' 11"  (1.803 m)   Wt 172 lb (78 kg)   BMI 23.99 kg/m     Assessment & Plan:   1. Body temperature low    Labs pending Keep warm with blankets RTO prn  Mary-Margaret Daphine DeutscherMartin, FNP

## 2017-05-01 NOTE — Patient Instructions (Signed)
Hypothermia Hypothermia is a drop in body temperature to 95F (35C) or lower. The average body temperature is between 97F (36.1C) and 99F (37.2C). Hypothermia is life-threatening because the body's organs cannot work properly when the body's temperature is too low. What are the causes? This condition may be caused by:  Being in cold weather or a cool room without being dressed warmly enough.  Getting wet, such as from rain or from falling into cool water.  Medical conditions that affect body temperature, such as hypothyroidism, diabetes, or Parkinson disease.  Medicines that affect the body's ability to control temperature.  What increases the risk? This condition is more likely to develop in:  People who are outside for long periods of time, such as hikers, hunters, or homeless people.  People who use drugs.  People who have difficulty moving around (impaired mobility).  Babies.  Elderly adults.  People who have a medical condition that affects body temperature.  People who take medicine that affects the body's ability to control temperature.  People who drink too much alcohol or who drink it too often.  What are the signs or symptoms? At first, symptoms of hypothermia may include:  Shivering. Shivering may happen early in hypothermia and may stop as the condition gets worse. Hypothermia does not always cause shivering.  Slow thinking, speech, and response time.  Sleepiness.  Confusion.  Pale skin.  Puffy or swollen face.  Numbness.  In infants, skin that is red and cold.  As hypothermia gets worse, the following symptoms may develop:  Uncoordinated movements.  Slow, shallow breathing.  Slow, weak pulse.  Loss of consciousness.  How is this diagnosed? This condition is diagnosed by taking your temperature. You may also have tests, such as:  Blood tests.  Glucose test.  Urine tests.  Liver tests.  Heart tests, such as an electrocardiogram  (ECG).  How is this treated? It is important to seek treatment as soon as possible. Hypothermia is an emergency that needs to be treated in a hospital. Treatment for hypothermia may include:  Receiving IV fluids.  Receiving warm, humidified oxygen through nasal tubing (nasal cannula), an oxygen mask, or a breathing tube.  Rewarming the blood by removing it, passing it through a hemodialysis machine, and returning it to the body at gradually increasing temperatures.  Receiving a warm solution through a small tube that goes into the stomach, bladder, or intestine (cavity lavage).  How is this prevented?  If you have an increased risk of developing hypothermia, keep your home at 68F (20C) or warmer.  Stay alert for dangerous weather situations and plan accordingly. Keep warm items in your house and car and observe safety precautions from government or weather officials.  Get out of cold water right away, and replace wet clothing with dry clothing as soon as possible.  Do not ignore signs that you are cold, such as shivering, cold feet and hands, and numbness.  If you know you will be in a cold environment: ? Wear layers of clothing. ? Do not drink alcohol. Alcohol causes your blood vessels to get larger (dilate) which makes you lose body heat. ? Cover up with blankets. ? Wear a waterproof jacket if you are outside in the rain or snow. Get help right away if:  You develop symptoms of hypothermia.  Hypothermia symptoms get worse or do not get better. This information is not intended to replace advice given to you by your health care provider. Make sure you discuss any questions   you have with your health care provider. Document Released: 12/19/2009 Document Revised: 04/08/2016 Document Reviewed: 04/08/2016 Elsevier Interactive Patient Education  [employer]2018 [employer]

## 2017-05-01 NOTE — Telephone Encounter (Signed)
What symptoms do you have? Feels achy, weak, temperature keeps going low  How long have you been sick? Since Monday  Have you been seen for this problem? Yes Tuesday by Lawanna KobusAngel  If your provider decides to give you a prescription, which [facility]   Patient informed that this information will be sent to the clinical staff for review and that they should receive a follow up call.

## 2017-05-02 ENCOUNTER — Telehealth: Payer: Self-pay | Admitting: Family Medicine

## 2017-05-02 LAB — THYROID PANEL WITH TSH
FREE THYROXINE INDEX: 2.2 (ref 1.2–4.9)
T3 Uptake Ratio: 26 % (ref 24–39)
T4, Total: 8.4 ug/dL (ref 4.5–12.0)
TSH: 3.05 u[IU]/mL (ref 0.450–4.500)

## 2017-05-02 LAB — CBC WITH DIFFERENTIAL/PLATELET
BASOS ABS: 0 10*3/uL (ref 0.0–0.2)
Basos: 1 %
EOS (ABSOLUTE): 0.1 10*3/uL (ref 0.0–0.4)
Eos: 1 %
Hematocrit: 45.7 % (ref 37.5–51.0)
Hemoglobin: 16 g/dL (ref 13.0–17.7)
IMMATURE GRANS (ABS): 0 10*3/uL (ref 0.0–0.1)
Immature Granulocytes: 0 %
LYMPHS ABS: 1.7 10*3/uL (ref 0.7–3.1)
Lymphs: 27 %
MCH: 28.3 pg (ref 26.6–33.0)
MCHC: 35 g/dL (ref 31.5–35.7)
MCV: 81 fL (ref 79–97)
MONOS ABS: 0.4 10*3/uL (ref 0.1–0.9)
Monocytes: 6 %
NEUTROS ABS: 4.1 10*3/uL (ref 1.4–7.0)
Neutrophils: 65 %
PLATELETS: 281 10*3/uL (ref 150–379)
RBC: 5.65 x10E6/uL (ref 4.14–5.80)
RDW: 14.2 % (ref 12.3–15.4)
WBC: 6.2 10*3/uL (ref 3.4–10.8)

## 2017-05-02 LAB — BMP8+EGFR
BUN/Creatinine Ratio: 11 (ref 9–20)
BUN: 12 mg/dL (ref 6–24)
CALCIUM: 9.7 mg/dL (ref 8.7–10.2)
CHLORIDE: 98 mmol/L (ref 96–106)
CO2: 25 mmol/L (ref 20–29)
CREATININE: 1.06 mg/dL (ref 0.76–1.27)
GFR calc non Af Amer: 84 mL/min/{1.73_m2} (ref >59)
GFR, EST AFRICAN AMERICAN: 97 mL/min/{1.73_m2} (ref >59)
Glucose: 104 mg/dL — ABNORMAL HIGH (ref 65–99)
POTASSIUM: 4.4 mmol/L (ref 3.5–5.2)
Sodium: 139 mmol/L (ref 134–144)

## 2017-05-02 NOTE — Telephone Encounter (Signed)
Please let the pt. Know that his blood work was normal. See if we can add a mono test to the blood drawn yesterday

## 2017-05-02 NOTE — Telephone Encounter (Signed)
Patient aware of results.  Add on sheet given

## 2017-05-06 LAB — MONONUCLEOSIS SCREEN: MONO SCREEN: NEGATIVE

## 2017-05-06 LAB — SPECIMEN STATUS REPORT

## 2017-06-18 DIAGNOSIS — Z7189 Other specified counseling: Secondary | ICD-10-CM | POA: Diagnosis not present

## 2017-06-18 DIAGNOSIS — Z719 Counseling, unspecified: Secondary | ICD-10-CM | POA: Diagnosis not present

## 2017-06-18 DIAGNOSIS — R7301 Impaired fasting glucose: Secondary | ICD-10-CM | POA: Diagnosis not present

## 2017-06-18 DIAGNOSIS — Z008 Encounter for other general examination: Secondary | ICD-10-CM | POA: Diagnosis not present

## 2017-09-22 ENCOUNTER — Ambulatory Visit: Payer: BLUE CROSS/BLUE SHIELD | Admitting: Family Medicine

## 2017-09-22 DIAGNOSIS — J069 Acute upper respiratory infection, unspecified: Secondary | ICD-10-CM | POA: Diagnosis not present

## 2017-09-22 DIAGNOSIS — J029 Acute pharyngitis, unspecified: Secondary | ICD-10-CM | POA: Diagnosis not present

## 2017-10-14 ENCOUNTER — Encounter: Payer: Self-pay | Admitting: Family Medicine

## 2017-10-14 ENCOUNTER — Ambulatory Visit: Payer: BLUE CROSS/BLUE SHIELD | Admitting: Family Medicine

## 2017-10-14 ENCOUNTER — Ambulatory Visit (INDEPENDENT_AMBULATORY_CARE_PROVIDER_SITE_OTHER): Payer: BLUE CROSS/BLUE SHIELD

## 2017-10-14 VITALS — BP 118/78 | HR 72 | Temp 97.3°F | Ht 71.0 in | Wt 176.0 lb

## 2017-10-14 DIAGNOSIS — M25532 Pain in left wrist: Secondary | ICD-10-CM | POA: Diagnosis not present

## 2017-10-14 MED ORDER — PREDNISONE 20 MG PO TABS: ORAL_TABLET | ORAL | 0 refills | Status: DC

## 2017-10-14 NOTE — Progress Notes (Signed)
BP 118/78   Pulse 72   Temp (!) 97.3 F (36.3 C) (Oral)   Ht 5\' 11"  (1.803 m)   Wt 176 lb (79.8 kg)   BMI 24.55 kg/m    Subjective:    Patient ID: Christopher Maxwell, male    DOB: 1971/02/22, 47 y.o.   MRN: [id number]  HPI: Christopher Maxwell is a 47 y.o. male presenting on 10/14/2017 for Pain in left thumb and wrist (had some type of injury in December whille pulling wife's boot off, felt something pop in arm, saw nurse at work who recommended anti-inflammatores and a brace; this pain began last week after carrying in several large pizzas at work)   HPI Left wrist pain Patient has left wrist pain that is been going on over the past 4 months intermittently.  He says he was trying to pull off his wife's no boots and heard something pop on the lateral aspect of his left wrist and has had pain extending from the thumb side of his palm into his wrist on the thumb side as well.  He says it will do fine with ibuprofen and improve but then he will do something with that hand like gripping or twisting that will cause it to flareup again.  He says most recently he was carrying some large pieces to work and that is when it flared up on him over the past week.  He denies any fevers or chills or numbness or weakness.  He denies any loss of grip strength.  Relevant past medical, surgical, family and social history reviewed and updated as indicated. Interim medical history since our last visit reviewed. Allergies and medications reviewed and updated.  Review of Systems  Constitutional: Negative for chills and fever.  Respiratory: Negative for shortness of breath and wheezing.   Cardiovascular: Negative for chest pain and leg swelling.  Musculoskeletal: Positive for arthralgias and myalgias. Negative for back pain and gait problem.  Skin: Negative for color change and rash.  All other systems reviewed and are negative.   Per HPI unless specifically indicated above   Allergies as of 10/14/2017    Reactions   Azithromycin Other (See Comments)   Hypothermia, diaphoresis, weakness   Penicillins       Medication List        Accurate as of 10/14/17 11:16 AM. Always use your most recent med list.          cholecalciferol 1000 units tablet Commonly known as:  VITAMIN D Take 1,000 Units by mouth daily.   metaxalone 800 MG tablet Commonly known as:  SKELAXIN Take 800 mg by mouth as needed.          Objective:    BP 118/78   Pulse 72   Temp (!) 97.3 F (36.3 C) (Oral)   Ht 5\' 11"  (1.803 m)   Wt 176 lb (79.8 kg)   BMI 24.55 kg/m   Wt Readings from Last 3 Encounters:  10/14/17 176 lb (79.8 kg)  05/01/17 172 lb (78 kg)  04/29/17 172 lb 9.6 oz (78.3 kg)    Physical Exam  Constitutional: He is oriented to person, place, and time. He appears well-developed and well-nourished. No distress.  Eyes: Conjunctivae are normal. No scleral icterus.  Cardiovascular: Normal rate, regular rhythm, normal heart sounds and intact distal pulses.  No murmur heard. Pulmonary/Chest: Effort normal and breath sounds normal. No respiratory distress. He has no wheezes. He has no rales.  Musculoskeletal: Normal range of motion. He exhibits  no edema.       Left wrist: He exhibits tenderness (Lateral tenderness over the palmar side of the thumb extending into the palmar side of the wrist). He exhibits normal range of motion, no bony tenderness, no swelling, no crepitus and no deformity.  Neurological: He is alert and oriented to person, place, and time. Coordination normal.  Skin: Skin is warm and dry. No rash noted. He is not diaphoretic.  Psychiatric: He has a normal mood and affect. His behavior is normal.  Nursing note and vitals reviewed.   Left wrist pain: No acute bony abnormalities noted, await final read from radiologist.    Assessment & Plan:   Problem List Items Addressed This Visit    None    Visit Diagnoses    Left wrist pain    -  Primary   Relevant Orders   DG Wrist  Complete Left    Likely tendinitis, will do short course of prednisone and physical therapy  Follow up plan: Return if symptoms worsen or fail to improve.  Counseling provided for all of the vaccine components Orders Placed This Encounter  Procedures  . DG Wrist Complete Left    Arville CareJoshua Lindyn Vossler, MD [facility] 10/14/2017, 11:16 AM

## 2017-10-27 ENCOUNTER — Ambulatory Visit: Payer: BLUE CROSS/BLUE SHIELD | Admitting: Physical Therapy

## 2017-11-24 DIAGNOSIS — Z008 Encounter for other general examination: Secondary | ICD-10-CM | POA: Diagnosis not present

## 2017-11-24 DIAGNOSIS — R7301 Impaired fasting glucose: Secondary | ICD-10-CM | POA: Diagnosis not present

## 2017-11-24 DIAGNOSIS — Z719 Counseling, unspecified: Secondary | ICD-10-CM | POA: Diagnosis not present

## 2017-11-24 DIAGNOSIS — Z1389 Encounter for screening for other disorder: Secondary | ICD-10-CM | POA: Diagnosis not present

## 2018-01-22 ENCOUNTER — Ambulatory Visit: Payer: BLUE CROSS/BLUE SHIELD | Admitting: Family Medicine

## 2018-01-22 ENCOUNTER — Encounter: Payer: Self-pay | Admitting: Family Medicine

## 2018-01-22 VITALS — BP 119/82 | HR 79 | Temp 97.2°F | Ht 71.0 in | Wt 175.2 lb

## 2018-01-22 DIAGNOSIS — J329 Chronic sinusitis, unspecified: Secondary | ICD-10-CM

## 2018-01-22 DIAGNOSIS — M792 Neuralgia and neuritis, unspecified: Secondary | ICD-10-CM

## 2018-01-22 MED ORDER — BETAMETHASONE SOD PHOS & ACET 6 (3-3) MG/ML IJ SUSP
6.0000 mg | Freq: Once | INTRAMUSCULAR | Status: AC
  Administered 2018-01-22: 6 mg via INTRAMUSCULAR

## 2018-01-22 NOTE — Progress Notes (Signed)
as well as the asthma   Subjective:  Patient ID: Norlene CampbellJonathan Furnas, male    DOB: 1971-03-16  Age: 47 y.o. MRN: [id number]  CC: face twitching (pt here today c/o face twitching, tingling around the left eye)   HPI Norlene CampbellJonathan Bermea presents for 2 weeks of increasing tingling and twitching around the left eye..  This is been accompanied by several headaches over the last few days.  Patient is unable to ascribe the headache pain since he does not usually have headaches.  There has been some upper respiratory congestion accompanying.  The pain is periorbital but also extends to the left temple and left parietal region.  It does not cross the midline.  It has not affected his vision or speech.  There has been no numbness or weakness of the region.  Patient is concerned because his father who is a diabetic had a severe problem where the nasal passage on the left side actually grew together and he had to have surgery.  There has been no nasal discharge drainage or cough noted.  No fever chills or sweats. Depression screen [facility] 2/9 10/14/2017 05/01/2017 04/29/2017  Decreased Interest 0 0 0  Down, Depressed, Hopeless 0 0 0  PHQ - 2 Score 0 0 0    History Christiane HaJonathan has a past medical history of Plantar fasciitis, bilateral.   He has a past surgical history that includes Wisdom tooth extraction and Vasectomy.   His family history includes Diabetes in his father.He reports that he quit smoking about 6 years ago. His smoking use included cigarettes. He has never used smokeless tobacco. He reports that he drinks about 1.2 oz of alcohol per week. He reports that he does not use drugs.    ROS Review of Systems  Constitutional: Negative.   HENT: Negative.  Negative for ear pain, hearing loss, postnasal drip and rhinorrhea.   Eyes: Negative for visual disturbance.  Respiratory: Negative for cough and shortness of breath.   Cardiovascular: Negative for chest pain and leg swelling.  Gastrointestinal: Negative  for abdominal pain, diarrhea, nausea and vomiting.  Genitourinary: Negative for difficulty urinating.  Musculoskeletal: Negative for arthralgias and myalgias.  Skin: Negative for rash.  Neurological: Positive for headaches.  Psychiatric/Behavioral: Negative for sleep disturbance.    Objective:  BP 119/82   Pulse 79   Temp (!) 97.2 F (36.2 C) (Oral)   Ht 5\' 11"  (1.803 m)   Wt 175 lb 4 oz (79.5 kg)   BMI 24.44 kg/m   BP Readings from Last 3 Encounters:  01/22/18 119/82  10/14/17 118/78  05/01/17 (!) 132/92    Wt Readings from Last 3 Encounters:  01/22/18 175 lb 4 oz (79.5 kg)  10/14/17 176 lb (79.8 kg)  05/01/17 172 lb (78 kg)     Physical Exam  Constitutional: He appears well-developed and well-nourished.  HENT:  Head: Normocephalic and atraumatic.  Right Ear: Tympanic membrane and external ear normal. No decreased hearing is noted.  Left Ear: Tympanic membrane and external ear normal. No decreased hearing is noted.  Nose: Mucosal edema present. Right sinus exhibits no frontal sinus tenderness. Left sinus exhibits no frontal sinus tenderness.  Mouth/Throat: No oropharyngeal exudate or posterior oropharyngeal erythema.  Neck: No Brudzinski's sign noted.  Pulmonary/Chest: Breath sounds normal. No respiratory distress.  Lymphadenopathy:       Head (right side): No preauricular adenopathy present.       Head (left side): No preauricular adenopathy present.       Right cervical:  No superficial cervical adenopathy present.      Left cervical: No superficial cervical adenopathy present.      Assessment & Plan:   Amedeo was seen today for face twitching.  Diagnoses and all orders for this visit:  Sinusitis, unspecified chronicity, unspecified location -     betamethasone acetate-betamethasone sodium phosphate (CELESTONE) injection 6 mg  Neuralgia       I have discontinued Hanley Mastro's metaxalone and predniSONE. I am also having him maintain his  cholecalciferol and ibuprofen. We administered betamethasone acetate-betamethasone sodium phosphate.  Allergies as of 01/22/2018      Reactions   Azithromycin Other (See Comments)   Hypothermia, diaphoresis, weakness   Penicillins       Medication List        Accurate as of 01/22/18  5:01 PM. Always use your most recent med list.          cholecalciferol 1000 units tablet Commonly known as:  VITAMIN D Take 1,000 Units by mouth daily.   ibuprofen 200 MG tablet Commonly known as:  ADVIL,MOTRIN Take by mouth.        Follow-up: Return if symptoms worsen or fail to improve.  Mechele Claude, M.D.

## 2018-02-17 ENCOUNTER — Ambulatory Visit (INDEPENDENT_AMBULATORY_CARE_PROVIDER_SITE_OTHER): Payer: BLUE CROSS/BLUE SHIELD | Admitting: Family Medicine

## 2018-02-17 ENCOUNTER — Encounter: Payer: Self-pay | Admitting: Family Medicine

## 2018-02-17 VITALS — BP 119/74 | HR 73 | Temp 97.3°F | Ht 71.0 in | Wt 174.5 lb

## 2018-02-17 DIAGNOSIS — R079 Chest pain, unspecified: Secondary | ICD-10-CM

## 2018-02-17 DIAGNOSIS — Z0000 Encounter for general adult medical examination without abnormal findings: Secondary | ICD-10-CM | POA: Diagnosis not present

## 2018-02-17 DIAGNOSIS — M41115 Juvenile idiopathic scoliosis, thoracolumbar region: Secondary | ICD-10-CM

## 2018-02-17 NOTE — Progress Notes (Signed)
Subjective:  Patient ID: Christopher Maxwell, male    DOB: 1970-12-29  Age: 47 y.o. MRN: [id number]  CC: Annual Exam   HPI Christopher Maxwell presents for annual physical examination.  Depression screen [facility] 2/9 02/17/2018 10/14/2017 05/01/2017  Decreased Interest 0 0 0  Down, Depressed, Hopeless 0 0 0  PHQ - 2 Score 0 0 0    History Christopher Maxwell has a past medical history of Plantar fasciitis, bilateral.   Christopher Maxwell has a past surgical history that includes Wisdom tooth extraction and Vasectomy.   His family history includes Diabetes in his father.Christopher Maxwell reports that Christopher Maxwell quit smoking about 6 years ago. His smoking use included cigarettes. Christopher Maxwell has never used smokeless tobacco. Christopher Maxwell reports that Christopher Maxwell drinks about 1.2 oz of alcohol per week. Christopher Maxwell reports that Christopher Maxwell does not use drugs.    ROS Review of Systems  Constitutional: Negative for activity change, fatigue and unexpected weight change.  HENT: Negative for congestion, ear pain, hearing loss, postnasal drip and trouble swallowing.   Eyes: Negative for pain and visual disturbance.  Respiratory: Negative for cough, chest tightness and shortness of breath.   Cardiovascular: Positive for chest pain (Occasional twinges only.  No radiation and mild evidence for exertional). Negative for palpitations and leg swelling.  Gastrointestinal: Negative for abdominal distention, abdominal pain, blood in stool, constipation, diarrhea, nausea and vomiting.  Endocrine: Negative for cold intolerance, heat intolerance and polydipsia.  Genitourinary: Negative for difficulty urinating, dysuria, flank pain, frequency and urgency.  Musculoskeletal: Negative for arthralgias and joint swelling.       Patient has a history of scoliosis.  Christopher Maxwell has been seen by Ortho and offered a rehab physical therapy program.  Christopher Maxwell says that Christopher Maxwell is not in any pain and has no deficits in activity so Christopher Maxwell has declined until further notice  Skin: Negative for color change, rash and wound.  Neurological: Negative  for dizziness, syncope, speech difficulty, weakness, light-headedness, numbness and headaches.  Hematological: Does not bruise/bleed easily.  Psychiatric/Behavioral: Negative for confusion, decreased concentration, dysphoric mood and sleep disturbance. The patient is not nervous/anxious.     Objective:  BP 119/74   Pulse 73   Temp (!) 97.3 F (36.3 C) (Oral)   Ht _0  (1.803 m)   Wt 174 lb 8 oz (79.2 kg)   BMI 24.34 kg/m   BP Readings from Last 3 Encounters:  02/17/18 119/74  01/22/18 119/82  10/14/17 118/78    Wt Readings from Last 3 Encounters:  02/17/18 174 lb 8 oz (79.2 kg)  01/22/18 175 lb 4 oz (79.5 kg)  10/14/17 176 lb (79.8 kg)     Physical Exam  Constitutional: Christopher Maxwell is oriented to person, place, and time. Christopher Maxwell appears well-developed and well-nourished.  HENT:  Head: Normocephalic and atraumatic.  Mouth/Throat: Oropharynx is clear and moist.  Eyes: Pupils are equal, round, and reactive to light. EOM are normal.  Neck: Normal range of motion. No tracheal deviation present. No thyromegaly present.  Cardiovascular: Normal rate, regular rhythm and normal heart sounds. Exam reveals no gallop and no friction rub.  No murmur heard. Pulmonary/Chest: Breath sounds normal. Christopher Maxwell has no wheezes. Christopher Maxwell has no rales.  Abdominal: Soft. Bowel sounds are normal. Christopher Maxwell exhibits no distension and no mass. There is no tenderness. Hernia confirmed negative in the right inguinal area and confirmed negative in the left inguinal area.  Genitourinary: Testes normal and penis normal.  Musculoskeletal: Normal range of motion. Christopher Maxwell exhibits deformity (Right scapula is winged due to scoliosis).  Christopher Maxwell exhibits no edema.  Lymphadenopathy:    Christopher Maxwell has no cervical adenopathy.  Neurological: Christopher Maxwell is alert and oriented to person, place, and time.  Skin: Skin is warm and dry.  Psychiatric: Christopher Maxwell has a normal mood and affect.      Assessment & Plan:   Christopher Maxwell was seen today for annual exam.  Diagnoses and all  orders for this visit:  Well adult exam -     CBC with Differential/Platelet; Future -     CMP14+EGFR; Future -     Lipid panel; Future -     PSA, total and free; Future -     VITAMIN D 25 Hydroxy (Vit-D Deficiency, Fractures); Future -     Urinalysis; Future  Chest pain, unspecified type -     EKG 12-Lead  Juvenile idiopathic scoliosis of thoracolumbar region       I am having Christopher Maxwell maintain his cholecalciferol and ibuprofen.  Allergies as of 02/17/2018      Reactions   Azithromycin Other (See Comments)   Hypothermia, diaphoresis, weakness   Penicillins       Medication List        Accurate as of 02/17/18  7:03 PM. Always use your most recent med list.          cholecalciferol 1000 units tablet Commonly known as:  VITAMIN D Take 1,000 Units by mouth daily.   ibuprofen 200 MG tablet Commonly known as:  ADVIL,MOTRIN Take by mouth.        Follow-up: No follow-ups on file.  Claretta Fraise, M.D.

## 2018-02-23 ENCOUNTER — Other Ambulatory Visit: Payer: BLUE CROSS/BLUE SHIELD

## 2018-02-23 DIAGNOSIS — Z0000 Encounter for general adult medical examination without abnormal findings: Secondary | ICD-10-CM

## 2018-02-24 LAB — LIPID PANEL
Chol/HDL Ratio: 2.4 ratio (ref 0.0–5.0)
Cholesterol, Total: 147 mg/dL (ref 100–199)
HDL: 62 mg/dL
LDL Calculated: 70 mg/dL (ref 0–99)
Triglycerides: 74 mg/dL (ref 0–149)
VLDL Cholesterol Cal: 15 mg/dL (ref 5–40)

## 2018-02-24 LAB — CMP14+EGFR
ALT: 20 [IU]/L (ref 0–44)
AST: 16 [IU]/L (ref 0–40)
Albumin/Globulin Ratio: 1.6 (ref 1.2–2.2)
Albumin: 4.4 g/dL (ref 3.5–5.5)
Alkaline Phosphatase: 62 [IU]/L (ref 39–117)
BUN/Creatinine Ratio: 12 (ref 9–20)
BUN: 13 mg/dL (ref 6–24)
Bilirubin Total: 0.6 mg/dL (ref 0.0–1.2)
CO2: 25 mmol/L (ref 20–29)
Calcium: 9.5 mg/dL (ref 8.7–10.2)
Chloride: 100 mmol/L (ref 96–106)
Creatinine, Ser: 1.05 mg/dL (ref 0.76–1.27)
GFR calc Af Amer: 99 mL/min/{1.73_m2}
GFR calc non Af Amer: 85 mL/min/{1.73_m2}
Globulin, Total: 2.7 g/dL (ref 1.5–4.5)
Glucose: 107 mg/dL — ABNORMAL HIGH (ref 65–99)
Potassium: 4.8 mmol/L (ref 3.5–5.2)
Sodium: 139 mmol/L (ref 134–144)
Total Protein: 7.1 g/dL (ref 6.0–8.5)

## 2018-02-24 LAB — CBC WITH DIFFERENTIAL/PLATELET
Basophils Absolute: 0.1 10*3/uL (ref 0.0–0.2)
Basos: 1 %
EOS (ABSOLUTE): 0.2 10*3/uL (ref 0.0–0.4)
Eos: 4 %
Hematocrit: 45.5 % (ref 37.5–51.0)
Hemoglobin: 15.5 g/dL (ref 13.0–17.7)
Immature Grans (Abs): 0 10*3/uL (ref 0.0–0.1)
Immature Granulocytes: 0 %
Lymphocytes Absolute: 1.5 10*3/uL (ref 0.7–3.1)
Lymphs: 34 %
MCH: 29.3 pg (ref 26.6–33.0)
MCHC: 34.1 g/dL (ref 31.5–35.7)
MCV: 86 fL (ref 79–97)
Monocytes Absolute: 0.3 10*3/uL (ref 0.1–0.9)
Monocytes: 7 %
Neutrophils Absolute: 2.4 10*3/uL (ref 1.4–7.0)
Neutrophils: 54 %
Platelets: 260 10*3/uL (ref 150–450)
RBC: 5.29 x10E6/uL (ref 4.14–5.80)
RDW: 13.8 % (ref 12.3–15.4)
WBC: 4.4 10*3/uL (ref 3.4–10.8)

## 2018-02-24 LAB — URINALYSIS
BILIRUBIN UA: NEGATIVE
Glucose, UA: NEGATIVE
KETONES UA: NEGATIVE
LEUKOCYTES UA: NEGATIVE
NITRITE UA: NEGATIVE
Protein, UA: NEGATIVE
RBC UA: NEGATIVE
Specific Gravity, UA: 1.025 (ref 1.005–1.030)
Urobilinogen, Ur: 0.2 mg/dL (ref 0.2–1.0)
pH, UA: 5.5 (ref 5.0–7.5)

## 2018-02-24 LAB — VITAMIN D 25 HYDROXY (VIT D DEFICIENCY, FRACTURES): Vit D, 25-Hydroxy: 35.6 ng/mL (ref 30.0–100.0)

## 2018-02-24 LAB — PSA, TOTAL AND FREE
PSA, Free Pct: 42.5 %
PSA, Free: 0.68 ng/mL
Prostate Specific Ag, Serum: 1.6 ng/mL (ref 0.0–4.0)

## 2018-03-02 ENCOUNTER — Encounter: Payer: Self-pay | Admitting: Family Medicine

## 2018-06-24 DIAGNOSIS — Z008 Encounter for other general examination: Secondary | ICD-10-CM | POA: Diagnosis not present

## 2018-06-24 DIAGNOSIS — R7301 Impaired fasting glucose: Secondary | ICD-10-CM | POA: Diagnosis not present

## 2018-06-24 DIAGNOSIS — Z7189 Other specified counseling: Secondary | ICD-10-CM | POA: Diagnosis not present

## 2018-06-24 DIAGNOSIS — Z719 Counseling, unspecified: Secondary | ICD-10-CM | POA: Diagnosis not present

## 2018-08-25 ENCOUNTER — Other Ambulatory Visit: Payer: Self-pay | Admitting: Family Medicine

## 2018-08-25 MED ORDER — OSELTAMIVIR PHOSPHATE 75 MG PO CAPS: 75.0000 mg | ORAL_CAPSULE | Freq: Every day | ORAL | 0 refills | Status: DC

## 2019-01-12 DIAGNOSIS — Z20828 Contact with and (suspected) exposure to other viral communicable diseases: Secondary | ICD-10-CM | POA: Diagnosis not present

## 2019-02-17 ENCOUNTER — Other Ambulatory Visit: Payer: Self-pay

## 2019-02-18 ENCOUNTER — Ambulatory Visit (INDEPENDENT_AMBULATORY_CARE_PROVIDER_SITE_OTHER): Payer: BLUE CROSS/BLUE SHIELD | Admitting: Family Medicine

## 2019-02-18 ENCOUNTER — Encounter: Payer: Self-pay | Admitting: Family Medicine

## 2019-02-18 VITALS — BP 124/85 | HR 79 | Temp 98.2°F | Ht 73.0 in | Wt 174.0 lb

## 2019-02-18 DIAGNOSIS — Z0001 Encounter for general adult medical examination with abnormal findings: Secondary | ICD-10-CM

## 2019-02-18 DIAGNOSIS — M4120 Other idiopathic scoliosis, site unspecified: Secondary | ICD-10-CM | POA: Diagnosis not present

## 2019-02-18 DIAGNOSIS — Z0000 Encounter for general adult medical examination without abnormal findings: Secondary | ICD-10-CM

## 2019-02-18 DIAGNOSIS — M7631 Iliotibial band syndrome, right leg: Secondary | ICD-10-CM | POA: Diagnosis not present

## 2019-02-18 MED ORDER — DICLOFENAC SODIUM 75 MG PO TBEC: 75.0000 mg | DELAYED_RELEASE_TABLET | Freq: Two times a day (BID) | ORAL | 2 refills | Status: DC

## 2019-02-18 NOTE — Progress Notes (Signed)
Subjective:  Patient ID: Christopher Maxwell, male    DOB: Jan 09, 1971  Age: 48 y.o. MRN: [id number]  CC: Annual Exam   HPI Christopher Maxwell presents for annual exam. Has jabbing pain at posterolateral right thigh, inferior to lesser Trochanter. No radiation .Christopher Maxwell has a history of scoliosis.   Depression screen [facility] 2/9 02/18/2019 02/17/2018 10/14/2017  Decreased Interest 0 0 0  Down, Depressed, Hopeless 0 0 0  PHQ - 2 Score 0 0 0  Altered sleeping 0 - -  Tired, decreased energy 0 - -  Change in appetite 0 - -  Feeling bad or failure about yourself  0 - -  Trouble concentrating 0 - -  Moving slowly or fidgety/restless 0 - -  Suicidal thoughts 0 - -  PHQ-9 Score 0 - -    History Christopher Maxwell has a past medical history of Plantar fasciitis, bilateral.   Christopher Maxwell has a past surgical history that includes Wisdom tooth extraction and Vasectomy.   His family history includes Cancer in his mother; Diabetes in his father.Christopher Maxwell reports that Christopher Maxwell quit smoking about 7 years ago. His smoking use included cigarettes. Christopher Maxwell has never used smokeless tobacco. Christopher Maxwell reports current alcohol use of about 2.0 standard drinks of alcohol per week. Christopher Maxwell reports that Christopher Maxwell does not use drugs.    ROS Review of Systems  Constitutional: Negative for activity change, fatigue and unexpected weight change.  HENT: Negative for congestion, ear pain, hearing loss, postnasal drip and trouble swallowing.   Eyes: Negative for pain and visual disturbance.  Respiratory: Negative for cough, chest tightness and shortness of breath.   Cardiovascular: Negative for chest pain, palpitations and leg swelling.  Gastrointestinal: Negative for abdominal distention, abdominal pain, blood in stool, constipation, diarrhea, nausea and vomiting.  Endocrine: Negative for cold intolerance, heat intolerance and polydipsia.  Genitourinary: Negative for difficulty urinating, dysuria, flank pain, frequency and urgency.  Musculoskeletal: Positive for arthralgias and  myalgias. Negative for joint swelling.  Skin: Negative for color change, rash and wound.  Neurological: Negative for dizziness, syncope, speech difficulty, weakness, light-headedness, numbness and headaches.  Hematological: Does not bruise/bleed easily.  Psychiatric/Behavioral: Negative for confusion, decreased concentration, dysphoric mood and sleep disturbance. The patient is not nervous/anxious.     Objective:  BP 124/85   Pulse 79   Temp 98.2 F (36.8 C) (Temporal)   Ht _0  (1.854 m)   Wt 174 lb (78.9 kg)   BMI 22.96 kg/m   BP Readings from Last 3 Encounters:  02/18/19 124/85  02/17/18 119/74  01/22/18 119/82    Wt Readings from Last 3 Encounters:  02/18/19 174 lb (78.9 kg)  02/17/18 174 lb 8 oz (79.2 kg)  01/22/18 175 lb 4 oz (79.5 kg)     Physical Exam Constitutional:      Appearance: Christopher Maxwell is well-developed.  HENT:     Head: Normocephalic and atraumatic.  Eyes:     Pupils: Pupils are equal, round, and reactive to light.  Neck:     Musculoskeletal: Normal range of motion.     Thyroid: No thyromegaly.     Trachea: No tracheal deviation.  Cardiovascular:     Rate and Rhythm: Normal rate and regular rhythm.     Heart sounds: Normal heart sounds. No murmur. No friction rub. No gallop.   Pulmonary:     Breath sounds: Normal breath sounds. No wheezing or rales.  Abdominal:     General: Bowel sounds are normal. There is no distension.  Palpations: Abdomen is soft. There is no mass.     Tenderness: There is no abdominal tenderness.     Hernia: There is no hernia in the left inguinal area.  Genitourinary:    Penis: Normal.      Scrotum/Testes: Normal.  Musculoskeletal: Normal range of motion.        General: Tenderness (right lateral hip and thigh) present.  Lymphadenopathy:     Cervical: No cervical adenopathy.  Skin:    General: Skin is warm and dry.  Neurological:     Mental Status: Christopher Maxwell is alert and oriented to person, place, and time.        Assessment & Plan:   Keelyn was seen today for annual exam.  Diagnoses and all orders for this visit:  Well adult exam -     CBC with Differential/Platelet -     CMP14+EGFR -     Lipid panel -     PSA Total (Reflex To Free) -     VITAMIN D 25 Hydroxy (Vit-D Deficiency, Fractures) -     Urinalysis  Other idiopathic scoliosis, unspecified spinal region  Iliotibial band syndrome of right side  Other orders -     diclofenac (VOLTAREN) 75 MG EC tablet; Take 1 tablet (75 mg total) by mouth 2 (two) times daily. For muscle and  Joint pain       I have discontinued Christopher Maxwell's oseltamivir. I am also having him start on diclofenac. Additionally, I am having him maintain his cholecalciferol, ibuprofen, metaxalone, and meloxicam.  Allergies as of 02/18/2019      Reactions   Azithromycin Other (See Comments)   Hypothermia, diaphoresis, weakness   Penicillins       Medication List       Accurate as of February 18, 2019 11:59 PM. If you have any questions, ask your nurse or doctor.        STOP taking these medications   oseltamivir 75 MG capsule Commonly known as: Tamiflu Stopped by: Claretta Fraise, MD     TAKE these medications   cholecalciferol 1000 units tablet Commonly known as: VITAMIN D Take 2,000 Units by mouth daily.   diclofenac 75 MG EC tablet Commonly known as: VOLTAREN Take 1 tablet (75 mg total) by mouth 2 (two) times daily. For muscle and  Joint pain Started by: Claretta Fraise, MD   ibuprofen 200 MG tablet Commonly known as: ADVIL Take by mouth.   meloxicam 15 MG tablet Commonly known as: MOBIC TAKE 1 TABLET BY MOUTH ONCE DAILY FOR 30 DAYS   metaxalone 800 MG tablet Commonly known as: SKELAXIN TAKE 1 TABLET BY MOUTH THREE TIMES DAILY AS NEEDED FOR MUSCLE SPASM TIGHTNESS        Follow-up: No follow-ups on file.  Claretta Fraise, M.D.

## 2019-02-19 ENCOUNTER — Other Ambulatory Visit: Payer: BC Managed Care – PPO

## 2019-02-19 ENCOUNTER — Other Ambulatory Visit: Payer: Self-pay

## 2019-02-19 DIAGNOSIS — Z0000 Encounter for general adult medical examination without abnormal findings: Secondary | ICD-10-CM | POA: Diagnosis not present

## 2019-02-19 LAB — URINALYSIS
Bilirubin, UA: NEGATIVE
Glucose, UA: NEGATIVE
Ketones, UA: NEGATIVE
Leukocytes,UA: NEGATIVE
Nitrite, UA: NEGATIVE
Protein,UA: NEGATIVE
RBC, UA: NEGATIVE
Specific Gravity, UA: >1.03 — ABNORMAL HIGH (ref 1.005–1.030)
Urobilinogen, Ur: 0.2 mg/dL (ref 0.2–1.0)
pH, UA: 6 (ref 5.0–7.5)

## 2019-02-21 ENCOUNTER — Encounter: Payer: Self-pay | Admitting: Family Medicine

## 2019-02-21 NOTE — Progress Notes (Signed)
Hello Tyke,  Your lab result is normal and/or stable.Some minor variations that are not significant are commonly marked abnormal, but do not represent any medical problem for you.  Best regards, Deira Shimer, M.D.

## 2019-02-22 LAB — CBC WITH DIFFERENTIAL/PLATELET
Basophils Absolute: 0.1 10*3/uL (ref 0.0–0.2)
Basos: 1 %
EOS (ABSOLUTE): 0.2 10*3/uL (ref 0.0–0.4)
Eos: 4 %
Hematocrit: 46 % (ref 37.5–51.0)
Hemoglobin: 15.7 g/dL (ref 13.0–17.7)
Immature Grans (Abs): 0 10*3/uL (ref 0.0–0.1)
Immature Granulocytes: 0 %
Lymphocytes Absolute: 1.5 10*3/uL (ref 0.7–3.1)
Lymphs: 33 %
MCH: 29 pg (ref 26.6–33.0)
MCHC: 34.1 g/dL (ref 31.5–35.7)
MCV: 85 fL (ref 79–97)
Monocytes Absolute: 0.4 10*3/uL (ref 0.1–0.9)
Monocytes: 8 %
Neutrophils Absolute: 2.5 10*3/uL (ref 1.4–7.0)
Neutrophils: 54 %
Platelets: 260 10*3/uL (ref 150–450)
RBC: 5.42 x10E6/uL (ref 4.14–5.80)
RDW: 13.2 % (ref 11.6–15.4)
WBC: 4.7 10*3/uL (ref 3.4–10.8)

## 2019-02-22 LAB — PSA TOTAL (REFLEX TO FREE): Prostate Specific Ag, Serum: 1.5 ng/mL (ref 0.0–4.0)

## 2019-02-22 LAB — CMP14+EGFR
ALT: 21 IU/L (ref 0–44)
AST: 15 IU/L (ref 0–40)
Albumin/Globulin Ratio: 1.9 (ref 1.2–2.2)
Albumin: 4.6 g/dL (ref 4.0–5.0)
Alkaline Phosphatase: 59 IU/L (ref 39–117)
BUN/Creatinine Ratio: 14 (ref 9–20)
BUN: 13 mg/dL (ref 6–24)
Bilirubin Total: 0.6 mg/dL (ref 0.0–1.2)
CO2: 26 mmol/L (ref 20–29)
Calcium: 9.3 mg/dL (ref 8.7–10.2)
Chloride: 101 mmol/L (ref 96–106)
Creatinine, Ser: 0.96 mg/dL (ref 0.76–1.27)
GFR calc Af Amer: 109 mL/min/{1.73_m2} (ref >59)
GFR calc non Af Amer: 94 mL/min/{1.73_m2} (ref >59)
Globulin, Total: 2.4 g/dL (ref 1.5–4.5)
Glucose: 98 mg/dL (ref 65–99)
Potassium: 4.9 mmol/L (ref 3.5–5.2)
Sodium: 140 mmol/L (ref 134–144)
Total Protein: 7 g/dL (ref 6.0–8.5)

## 2019-02-22 LAB — LIPID PANEL
Chol/HDL Ratio: 2.5 ratio (ref 0.0–5.0)
Cholesterol, Total: 171 mg/dL (ref 100–199)
HDL: 69 mg/dL (ref >39)
LDL Calculated: 91 mg/dL (ref 0–99)
Triglycerides: 54 mg/dL (ref 0–149)
VLDL Cholesterol Cal: 11 mg/dL (ref 5–40)

## 2019-02-22 LAB — VITAMIN D 25 HYDROXY (VIT D DEFICIENCY, FRACTURES): Vit D, 25-Hydroxy: 42.5 ng/mL (ref 30.0–100.0)

## 2019-02-28 ENCOUNTER — Encounter: Payer: Self-pay | Admitting: Family Medicine

## 2019-05-10 DIAGNOSIS — Z719 Counseling, unspecified: Secondary | ICD-10-CM | POA: Diagnosis not present

## 2019-05-10 DIAGNOSIS — Z7189 Other specified counseling: Secondary | ICD-10-CM | POA: Diagnosis not present

## 2019-05-10 DIAGNOSIS — R7301 Impaired fasting glucose: Secondary | ICD-10-CM | POA: Diagnosis not present

## 2019-05-10 DIAGNOSIS — Z008 Encounter for other general examination: Secondary | ICD-10-CM | POA: Diagnosis not present

## 2019-07-02 ENCOUNTER — Telehealth: Payer: Self-pay

## 2019-07-02 DIAGNOSIS — Z6823 Body mass index (BMI) 23.0-23.9, adult: Secondary | ICD-10-CM | POA: Diagnosis not present

## 2019-07-02 DIAGNOSIS — R42 Dizziness and giddiness: Secondary | ICD-10-CM | POA: Diagnosis not present

## 2019-07-02 DIAGNOSIS — R030 Elevated blood-pressure reading, without diagnosis of hypertension: Secondary | ICD-10-CM | POA: Diagnosis not present

## 2019-07-02 DIAGNOSIS — R0602 Shortness of breath: Secondary | ICD-10-CM | POA: Diagnosis not present

## 2019-07-02 NOTE — Telephone Encounter (Signed)
Patient states that he hasn't felt well in a day or so. His BP today was 158/88 and his pulse was 92. He is having some tingling in his arm and all over. He saw his nurse at work and they advised he contact his PCP. No appointments available for today. Advised patient to go to Urgent Care for an EKG and further evaluation. Patient verbalized understanding

## 2019-07-05 ENCOUNTER — Encounter: Payer: Self-pay | Admitting: Family

## 2019-07-05 ENCOUNTER — Other Ambulatory Visit: Payer: Self-pay

## 2019-07-05 ENCOUNTER — Ambulatory Visit: Payer: BC Managed Care – PPO | Admitting: Family

## 2019-07-05 VITALS — BP 111/79 | HR 83 | Temp 98.9°F | Ht 73.0 in | Wt 169.8 lb

## 2019-07-05 DIAGNOSIS — F419 Anxiety disorder, unspecified: Secondary | ICD-10-CM

## 2019-07-05 DIAGNOSIS — Z09 Encounter for follow-up examination after completed treatment for conditions other than malignant neoplasm: Secondary | ICD-10-CM | POA: Diagnosis not present

## 2019-07-05 DIAGNOSIS — R030 Elevated blood-pressure reading, without diagnosis of hypertension: Secondary | ICD-10-CM

## 2019-07-05 NOTE — Progress Notes (Signed)
Subjective:    Patient ID: Christopher Maxwell, male    DOB: Apr 19, 1971, 48 y.o.   MRN: [id number]  Chief Complaint  Patient presents with  . Hypertension    follow up from urgent care   Pt presents to the office today to recheck HTN. He had SOB on Friday and went to the Urgent Care on 07/02/19. His BP was elevated 175/90. He had a negative COVID test, normal CBC and BMP.   He was started on Lisinopril 5 mg. His BP today is normal. He states his SOB has resolved.   He does report his stress has been very increased lately as a good friend committed suicide, increased stress at work, and end of year items.   Hypertension This is a new problem. The current episode started 1 to 4 weeks ago. The problem has been waxing and waning since onset. The problem is controlled. Associated symptoms include anxiety. Pertinent negatives include no headaches, malaise/fatigue, peripheral edema or shortness of breath. Past treatments include ACE inhibitors. The current treatment provides moderate improvement. There is no history of kidney disease, CAD/MI, CVA or heart failure.      Review of Systems  Constitutional: Negative for malaise/fatigue.  Respiratory: Negative for shortness of breath.   Neurological: Negative for headaches.  All other systems reviewed and are negative.      Objective:   Physical Exam Vitals reviewed.  Constitutional:      General: He is not in acute distress.    Appearance: He is well-developed.  HENT:     Head: Normocephalic.     Right Ear: Tympanic membrane normal.     Left Ear: Tympanic membrane normal.  Eyes:     General:        Right eye: No discharge.        Left eye: No discharge.     Pupils: Pupils are equal, round, and reactive to light.  Neck:     Thyroid: No thyromegaly.  Cardiovascular:     Rate and Rhythm: Normal rate and regular rhythm.     Heart sounds: Normal heart sounds. No murmur.  Pulmonary:     Effort: Pulmonary effort is normal. No  respiratory distress.     Breath sounds: Normal breath sounds. No wheezing.  Abdominal:     General: Bowel sounds are normal. There is no distension.     Palpations: Abdomen is soft.     Tenderness: There is no abdominal tenderness.  Musculoskeletal:        General: No tenderness. Normal range of motion.     Cervical back: Normal range of motion and neck supple.  Skin:    General: Skin is warm and dry.     Findings: No erythema or rash.  Neurological:     Mental Status: He is alert and oriented to person, place, and time.     Cranial Nerves: No cranial nerve deficit.     Deep Tendon Reflexes: Reflexes are normal and symmetric.  Psychiatric:        Behavior: Behavior normal.        Thought Content: Thought content normal.        Judgment: Judgment normal.       BP 111/79   Pulse 83   Temp 98.9 F (37.2 C) (Temporal)   Ht 6\' 1"  (1.854 m)   Wt 169 lb 12.8 oz (77 kg)   SpO2 96%   BMI 22.40 kg/m      Assessment & Plan:  HURSHEL BOUILLON comes in today with chief complaint of Hypertension (follow up from urgent care)   Diagnosis and orders addressed:  1. Follow-up examination  2. Elevated blood pressure reading  3. Anxiety  Urgent Care notes and & labs reviewed Will stop Lisinopril today since BP is normal and he is only taking low dose Lisinopril. Could have been related to stress and anxiety?  He will monitor his BP at home and let us know if >140/90 Stress management  SOB resolved and lung sounds great  Jannifer Rodney, FNP

## 2019-07-05 NOTE — Patient Instructions (Signed)

## 2019-07-06 ENCOUNTER — Telehealth: Payer: Self-pay | Admitting: Family

## 2019-07-06 NOTE — Telephone Encounter (Signed)
Patient says he had an appt with [facility] yesterday and was told to stop taking his BP medication. Patient says he didn't take any BP medication last night or today and says he feels weird, mostly in his chest, lungs, and arms. Says he checked his BP and it was good around 11:00. Wants advise from nurse.

## 2019-07-06 NOTE — Telephone Encounter (Signed)
Pt denies any pain, says it feels like a tingly sensation in his arms and chest?

## 2019-07-07 ENCOUNTER — Ambulatory Visit: Payer: BC Managed Care – PPO | Admitting: Family Medicine

## 2019-07-07 ENCOUNTER — Encounter: Payer: Self-pay | Admitting: Family Medicine

## 2019-07-07 ENCOUNTER — Other Ambulatory Visit: Payer: Self-pay

## 2019-07-07 VITALS — BP 135/82 | HR 85 | Temp 100.2°F | Ht 73.0 in | Wt 169.4 lb

## 2019-07-07 DIAGNOSIS — I10 Essential (primary) hypertension: Secondary | ICD-10-CM | POA: Diagnosis not present

## 2019-07-07 NOTE — Progress Notes (Signed)
Assessment & Plan:  1. Essential hypertension - Encouraged patient to resume lisinopril due to elevated readings when he was off of it as well as headaches.  Advised him to keep a log of his blood pressure once daily and bring it with him when he returns.  Discussed goal of blood pressure less than 140/90 and when to go to the ER for elevated blood pressure.  Written education provided on hypertension.   Follow up plan: Return in about 3 weeks (around 07/28/2019) for HTN.  Hendricks Limes, MSN, APRN, FNP-C [facility]  Subjective:   Patient ID: Christopher Maxwell, male    DOB: 1970-08-10, 48 y.o.   MRN: [id number]  HPI: Christopher Maxwell is a 48 y.o. male presenting on 07/07/2019 for Hypertension  Patient reports last Friday, 07/02/2019, at work he was feeling short of breath and like he was going to pass out.  The nurse at work took his blood pressure and it was 155/88.  Patient went to urgent care where he had an EKG that was normal, a COVID-19 test that was negative, and CBC/BMP that was also normal.  He reports he was given lisinopril 5 mg once daily.  Patient reports while he was taking lisinopril he was feeling better and his blood pressure was down.  Patient followed up here in our office on 07/05/2019 at which time he was told to stop the lisinopril.  He reports since he stopped the medication he has been having headaches and tingling in bilateral bicep muscles.  He does report that he has been having frequent headaches prior to starting the lisinopril.  After stopping the lisinopril his blood pressure readings were 142/90 and 154/?  He took a lisinopril yesterday evening due to his headache and elevated BP and his headache resolved.  Patient denies any chest pain, shortness of breath, fatigue, dizziness, jaw pain, and neck issues.   ROS: Negative unless specifically indicated above in HPI.   Relevant past medical history reviewed and updated as indicated.    Allergies and medications reviewed and updated.   Current Outpatient Medications:  .  cholecalciferol (VITAMIN D) 1000 units tablet, Take 2,000 Units by mouth daily. , Disp: , Rfl:  .  metaxalone (SKELAXIN) 800 MG tablet, TAKE 1 TABLET BY MOUTH THREE TIMES DAILY AS NEEDED FOR MUSCLE SPASM TIGHTNESS, Disp: , Rfl:  .  lisinopril (ZESTRIL) 5 MG tablet, Take 5 mg by mouth daily., Disp: , Rfl:   Allergies  Allergen Reactions  . Azithromycin Other (See Comments)    Hypothermia, diaphoresis, weakness  . Penicillins     Objective:   BP 135/82   Pulse 85   Temp 100.2 F (37.9 C) (Temporal)   Ht 6\' 1"  (1.854 m)   Wt 169 lb 6.4 oz (76.8 kg)   SpO2 99%   BMI 22.35 kg/m    Physical Exam Vitals reviewed.  Constitutional:      General: He is not in acute distress.    Appearance: Normal appearance. He is normal weight. He is not ill-appearing, toxic-appearing or diaphoretic.  HENT:     Head: Normocephalic and atraumatic.  Eyes:     General: No scleral icterus.       Right eye: No discharge.        Left eye: No discharge.     Conjunctiva/sclera: Conjunctivae normal.  Cardiovascular:     Rate and Rhythm: Normal rate and regular rhythm.     Heart sounds: Normal heart sounds. No  murmur. No friction rub. No gallop.   Pulmonary:     Effort: Pulmonary effort is normal. No respiratory distress.     Breath sounds: Normal breath sounds. No stridor. No wheezing, rhonchi or rales.  Musculoskeletal:        General: Normal range of motion.     Cervical back: Normal range of motion.  Skin:    General: Skin is warm and dry.  Neurological:     Mental Status: He is alert and oriented to person, place, and time. Mental status is at baseline.  Psychiatric:        Mood and Affect: Mood normal.        Behavior: Behavior normal.        Thought Content: Thought content normal.        Judgment: Judgment normal.

## 2019-07-07 NOTE — Patient Instructions (Signed)

## 2019-07-28 ENCOUNTER — Encounter: Payer: Self-pay | Admitting: Family Medicine

## 2019-07-28 ENCOUNTER — Other Ambulatory Visit: Payer: Self-pay

## 2019-07-28 DIAGNOSIS — I10 Essential (primary) hypertension: Secondary | ICD-10-CM

## 2019-07-28 HISTORY — DX: Essential (primary) hypertension: I10

## 2019-07-28 NOTE — Progress Notes (Signed)
Assessment & Plan:  1. Essential hypertension - Controlled. I have decreased dosage to 2.5 mg once daily from 5 mg once daily to see if this improves patient's abnormal sensation of feeling cold/thin. His blood pressure is low enough I do not think this will effect him in a negative manner. Encouraged to resume exercise.  - lisinopril (ZESTRIL) 2.5 MG tablet; Take 1 tablet (2.5 mg total) by mouth daily.  Dispense: 30 tablet; Refill: 2  2. Stress at work - Encouraged to try hydroxyzine PRN for times of increased stress. He does not wish to be on a daily medication.  - hydrOXYzine (ATARAX/VISTARIL) 25 MG tablet; Take 0.5-1 tablets (12.5-25 mg total) by mouth 3 (three) times daily as needed for anxiety.  Dispense: 30 tablet; Refill: 0   Follow up plan: Return if symptoms worsen or fail to improve.  Deliah Boston, MSN, APRN, FNP-C [facility]  Subjective:   Patient ID: Christopher Maxwell, male    DOB: 11/06/1970, 49 y.o.   MRN: [id number]  HPI: Christopher Maxwell is a 49 y.o. male presenting on 07/29/2019 for Hypertension (3 week follow up)  Patient is here to follow-up on hypertension.  He resumed lisinopril 5 mg once daily approximately 3 weeks ago due to elevated BP readings and headaches.  He has been keeping track of his blood pressure at home - 111/70, 111/76, 96/72, 113/82. He reports he sometimes feels cold or thin on the inside and is not sure if it is related to his medication or his mind. He is under a tremendous amount of stress at work and lost his best friend ~2 weeks prior to our last visit.    ROS: Negative unless specifically indicated above in HPI.   Relevant past medical history reviewed and updated as indicated.   Allergies and medications reviewed and updated.   Current Outpatient Medications:  .  cholecalciferol (VITAMIN D) 1000 units tablet, Take 2,000 Units by mouth daily. , Disp: , Rfl:  .  lisinopril (ZESTRIL) 2.5 MG tablet, Take 1 tablet  (2.5 mg total) by mouth daily., Disp: 30 tablet, Rfl: 2 .  metaxalone (SKELAXIN) 800 MG tablet, TAKE 1 TABLET BY MOUTH THREE TIMES DAILY AS NEEDED FOR MUSCLE SPASM TIGHTNESS, Disp: , Rfl:  .  hydrOXYzine (ATARAX/VISTARIL) 25 MG tablet, Take 0.5-1 tablets (12.5-25 mg total) by mouth 3 (three) times daily as needed for anxiety., Disp: 30 tablet, Rfl: 0  Allergies  Allergen Reactions  . Azithromycin Other (See Comments)    Hypothermia, diaphoresis, weakness  . Penicillins     Objective:   BP 117/79   Pulse (!) 103   Temp 98 F (36.7 C) (Temporal)   Ht 6\' 1"  (1.854 m)   Wt 169 lb (76.7 kg)   SpO2 99%   BMI 22.30 kg/m    Physical Exam Vitals reviewed.  Constitutional:      General: He is not in acute distress.    Appearance: Normal appearance. He is not ill-appearing, toxic-appearing or diaphoretic.  HENT:     Head: Normocephalic and atraumatic.  Eyes:     General: No scleral icterus.       Right eye: No discharge.        Left eye: No discharge.     Conjunctiva/sclera: Conjunctivae normal.  Cardiovascular:     Rate and Rhythm: Regular rhythm. Tachycardia present.     Heart sounds: Normal heart sounds. No murmur. No friction rub. No gallop.   Pulmonary:  Effort: Pulmonary effort is normal. No respiratory distress.     Breath sounds: Normal breath sounds. No stridor. No wheezing, rhonchi or rales.  Musculoskeletal:        General: Normal range of motion.     Cervical back: Normal range of motion.  Skin:    General: Skin is warm and dry.  Neurological:     Mental Status: He is alert and oriented to person, place, and time. Mental status is at baseline.  Psychiatric:        Mood and Affect: Mood normal.        Behavior: Behavior normal.        Thought Content: Thought content normal.        Judgment: Judgment normal.

## 2019-07-29 ENCOUNTER — Ambulatory Visit: Payer: BC Managed Care – PPO | Admitting: Family Medicine

## 2019-07-29 ENCOUNTER — Encounter: Payer: Self-pay | Admitting: Family Medicine

## 2019-07-29 VITALS — BP 117/79 | HR 103 | Temp 98.0°F | Ht 73.0 in | Wt 169.0 lb

## 2019-07-29 DIAGNOSIS — Z566 Other physical and mental strain related to work: Secondary | ICD-10-CM | POA: Diagnosis not present

## 2019-07-29 DIAGNOSIS — I10 Essential (primary) hypertension: Secondary | ICD-10-CM

## 2019-07-29 MED ORDER — LISINOPRIL 2.5 MG PO TABS: 2.5000 mg | ORAL_TABLET | Freq: Every day | ORAL | 2 refills | Status: DC

## 2019-07-29 MED ORDER — HYDROXYZINE HCL 25 MG PO TABS: 12.5000 mg | ORAL_TABLET | Freq: Three times a day (TID) | ORAL | 0 refills | Status: DC | PRN

## 2019-09-29 ENCOUNTER — Telehealth: Payer: Self-pay | Admitting: Family Medicine

## 2019-09-29 NOTE — Telephone Encounter (Signed)
  Medication Request  09/29/2019  What is the name of the medication? lisinopril (ZESTRIL) 2.5 MG tablet  Have you contacted your pharmacy to request a refill? yes  Which pharmacy would you like this sent to? [facility]   Patient notified that their request is being sent to the clinical staff for review and that they should receive a call once it is complete. If they do not receive a call within 24 hours they can check with their pharmacy or our office.

## 2019-10-27 ENCOUNTER — Telehealth: Payer: Self-pay | Admitting: Family Medicine

## 2019-10-27 ENCOUNTER — Encounter: Payer: Self-pay | Admitting: Family Medicine

## 2019-10-27 DIAGNOSIS — I10 Essential (primary) hypertension: Secondary | ICD-10-CM

## 2019-10-27 MED ORDER — LISINOPRIL 2.5 MG PO TABS: 2.5000 mg | ORAL_TABLET | Freq: Every day | ORAL | 2 refills | Status: DC

## 2019-10-27 NOTE — Telephone Encounter (Signed)
  Prescription Request  10/27/2019  What is the name of the medication or equipment? lisinopril (ZESTRIL) 2.5 MG tablet    Have you contacted your pharmacy to request a refill? (if applicable) yes  Which pharmacy would you like this sent to? [facility] [place] pt is about to be out  Pt has appt with Dr. Darlyn Read in August  Patient notified that their request is being sent to the clinical staff for review and that they should receive a response within 2 business days.

## 2020-02-08 ENCOUNTER — Other Ambulatory Visit: Payer: Self-pay | Admitting: Family Medicine

## 2020-02-08 DIAGNOSIS — I10 Essential (primary) hypertension: Secondary | ICD-10-CM

## 2020-02-08 MED ORDER — LISINOPRIL 2.5 MG PO TABS: 2.5000 mg | ORAL_TABLET | Freq: Every day | ORAL | 0 refills | Status: DC

## 2020-02-08 NOTE — Telephone Encounter (Signed)
LMOVM refill sent to pharmacy 

## 2020-02-08 NOTE — Telephone Encounter (Signed)
  Prescription Request  02/08/2020  What is the name of the medication or equipment? lisinopril (ZESTRIL) 2.5 MG tablet    Have you contacted your pharmacy to request a refill? (if applicable) yes  Which pharmacy would you like this sent to? [facility] in [place]   Patient notified that their request is being sent to the clinical staff for review and that they should receive a response within 2 business days.

## 2020-02-21 ENCOUNTER — Other Ambulatory Visit: Payer: Self-pay

## 2020-02-21 ENCOUNTER — Encounter: Payer: Self-pay | Admitting: Family Medicine

## 2020-02-21 ENCOUNTER — Ambulatory Visit (INDEPENDENT_AMBULATORY_CARE_PROVIDER_SITE_OTHER): Payer: BC Managed Care – PPO | Admitting: Family Medicine

## 2020-02-21 VITALS — BP 113/81 | HR 71 | Temp 97.9°F | Ht 73.0 in | Wt 167.8 lb

## 2020-02-21 DIAGNOSIS — M4120 Other idiopathic scoliosis, site unspecified: Secondary | ICD-10-CM | POA: Diagnosis not present

## 2020-02-21 DIAGNOSIS — K2100 Gastro-esophageal reflux disease with esophagitis, without bleeding: Secondary | ICD-10-CM

## 2020-02-21 DIAGNOSIS — I10 Essential (primary) hypertension: Secondary | ICD-10-CM | POA: Diagnosis not present

## 2020-02-21 DIAGNOSIS — Z114 Encounter for screening for human immunodeficiency virus [HIV]: Secondary | ICD-10-CM

## 2020-02-21 DIAGNOSIS — L719 Rosacea, unspecified: Secondary | ICD-10-CM

## 2020-02-21 DIAGNOSIS — Z0001 Encounter for general adult medical examination with abnormal findings: Secondary | ICD-10-CM

## 2020-02-21 DIAGNOSIS — Z0000 Encounter for general adult medical examination without abnormal findings: Secondary | ICD-10-CM | POA: Diagnosis not present

## 2020-02-21 DIAGNOSIS — Z125 Encounter for screening for malignant neoplasm of prostate: Secondary | ICD-10-CM

## 2020-02-21 DIAGNOSIS — Z1159 Encounter for screening for other viral diseases: Secondary | ICD-10-CM

## 2020-02-21 DIAGNOSIS — E559 Vitamin D deficiency, unspecified: Secondary | ICD-10-CM | POA: Diagnosis not present

## 2020-02-21 LAB — URINALYSIS
Bilirubin, UA: NEGATIVE
Glucose, UA: NEGATIVE
Ketones, UA: NEGATIVE
Leukocytes,UA: NEGATIVE
Nitrite, UA: NEGATIVE
Protein,UA: NEGATIVE
RBC, UA: NEGATIVE
Specific Gravity, UA: 1.025 (ref 1.005–1.030)
Urobilinogen, Ur: 0.2 mg/dL (ref 0.2–1.0)
pH, UA: 5.5 (ref 5.0–7.5)

## 2020-02-21 MED ORDER — MOMETASONE FUROATE 0.1 % EX CREA: 1.0000 "application " | TOPICAL_CREAM | Freq: Every day | CUTANEOUS | 5 refills | Status: DC

## 2020-02-21 MED ORDER — PANTOPRAZOLE SODIUM 40 MG PO TBEC: 40.0000 mg | DELAYED_RELEASE_TABLET | Freq: Every day | ORAL | 11 refills | Status: DC

## 2020-02-21 NOTE — Addendum Note (Signed)
Addended by: Mechele Claude on: 02/21/2020 01:06 PM   Modules accepted: Orders

## 2020-02-21 NOTE — Progress Notes (Addendum)
Subjective:  Patient ID: Christopher Maxwell, male    DOB: [birth year]  Age: 49 y.o. MRN: [id number]  CC: Annual Exam   HPI Christopher Maxwell presents for annual exam  Depression screen [facility] 2/9 02/21/2020 07/29/2019 07/05/2019  Decreased Interest 0 0 0  Down, Depressed, Hopeless 0 0 0  PHQ - 2 Score 0 0 0  Altered sleeping - - -  Tired, decreased energy - - -  Change in appetite - - -  Feeling bad or failure about yourself  - - -  Trouble concentrating - - -  Moving slowly or fidgety/restless - - -  Suicidal thoughts - - -  PHQ-9 Score - - -    History Christopher Maxwell has a past medical history of Essential hypertension (07/28/2019) and Plantar fasciitis, bilateral.   He has a past surgical history that includes Wisdom tooth extraction and Vasectomy.   His family history includes Cancer in his mother; Diabetes in his father.He reports that he quit smoking about 8 years ago. His smoking use included cigarettes. He has never used smokeless tobacco. He reports current alcohol use of about 2.0 standard drinks of alcohol per week. He reports that he does not use drugs.    ROS Review of Systems  Constitutional: Negative for activity change, fatigue and unexpected weight change.  HENT: Negative for congestion, ear pain, hearing loss, postnasal drip and trouble swallowing.   Eyes: Negative for pain and visual disturbance.  Respiratory: Negative for cough, chest tightness and shortness of breath.   Cardiovascular: Negative for chest pain, palpitations and leg swelling.  Gastrointestinal: Negative for abdominal distention, blood in stool, constipation, diarrhea, nausea and vomiting.       Awakening with choking sensation in the night. Feels a tickle, coughs too. Did not respond to allergy meds  Endocrine: Negative for cold intolerance, heat intolerance and polydipsia.  Genitourinary: Negative for difficulty urinating, dysuria, flank pain, frequency and urgency.  Musculoskeletal: Positive for  arthralgias (occasional  morning stiffness). Negative for joint swelling.  Skin: Negative for color change, rash and wound.  Neurological: Negative for dizziness, syncope, speech difficulty, weakness, light-headedness, numbness and headaches.  Hematological: Does not bruise/bleed easily.  Psychiatric/Behavioral: Negative for confusion, decreased concentration, dysphoric mood and sleep disturbance. The patient is not nervous/anxious.     Objective:  BP 113/81   Pulse 71   Temp 97.9 F (36.6 C) (Temporal)   Ht _0  (1.854 m)   Wt 167 lb 12.8 oz (76.1 kg)   BMI 22.14 kg/m   BP Readings from Last 3 Encounters:  02/21/20 113/81  07/29/19 117/79  07/07/19 135/82    Wt Readings from Last 3 Encounters:  02/21/20 167 lb 12.8 oz (76.1 kg)  07/29/19 169 lb (76.7 kg)  07/07/19 169 lb 6.4 oz (76.8 kg)     Physical Exam Constitutional:      Appearance: He is well-developed.  HENT:     Head: Normocephalic and atraumatic.  Eyes:     Pupils: Pupils are equal, round, and reactive to light.  Neck:     Thyroid: No thyromegaly.     Trachea: No tracheal deviation.  Cardiovascular:     Rate and Rhythm: Normal rate and regular rhythm.     Heart sounds: Normal heart sounds. No murmur heard.  No friction rub. No gallop.   Pulmonary:     Breath sounds: Normal breath sounds. No wheezing or rales.  Abdominal:     General: Bowel sounds are normal. There is no  distension.     Palpations: Abdomen is soft. There is no mass.     Tenderness: There is no abdominal tenderness.     Hernia: There is no hernia in the left inguinal area.  Genitourinary:    Penis: Normal.      Testes: Normal.  Musculoskeletal:        General: Normal range of motion.     Cervical back: Normal range of motion.  Lymphadenopathy:     Cervical: No cervical adenopathy.  Skin:    General: Skin is warm and dry.  Neurological:     Mental Status: He is alert and oriented to person, place, and time.       Assessment  & Plan:   Christopher Maxwell was seen today for annual exam.  Diagnoses and all orders for this visit:  Well adult exam -     CBC with Differential/Platelet -     CMP14+EGFR -     Lipid panel -     PSA Total (Reflex To Free) -     Urinalysis -     VITAMIN D 25 Hydroxy (Vit-D Deficiency, Fractures)  Essential hypertension -     CBC with Differential/Platelet -     CMP14+EGFR -     Lipid panel -     PSA Total (Reflex To Free) -     Urinalysis -     VITAMIN D 25 Hydroxy (Vit-D Deficiency, Fractures)  Other idiopathic scoliosis, unspecified spinal region  Screening for prostate cancer -     PSA Total (Reflex To Free)  Vitamin D deficiency -     VITAMIN D 25 Hydroxy (Vit-D Deficiency, Fractures)  Encounter for screening for HIV -     HIV Antibody (routine testing w rflx)  Need for hepatitis C screening test -     Hepatitis C antibody  Rosacea  Gastroesophageal reflux disease with esophagitis without hemorrhage  Other orders -     mometasone (ELOCON) 0.1 % cream; Apply 1 application topically daily. To affected areas -     pantoprazole (PROTONIX) 40 MG tablet; Take 1 tablet (40 mg total) by mouth daily. For stomach       I have discontinued Christopher Maxwell's metaxalone, hydrOXYzine, and lisinopril. I am also having him start on mometasone and pantoprazole. Additionally, I am having him maintain his cholecalciferol.  Allergies as of 02/21/2020      Reactions   Azithromycin Other (See Comments)   Hypothermia, diaphoresis, weakness   Penicillins       Medication List       Accurate as of February 21, 2020  1:06 PM. If you have any questions, ask your nurse or doctor.        STOP taking these medications   hydrOXYzine 25 MG tablet Commonly known as: ATARAX/VISTARIL Stopped by: Mechele Claude, MD   lisinopril 2.5 MG tablet Commonly known as: ZESTRIL Stopped by: Mechele Claude, MD   metaxalone 800 MG tablet Commonly known as: SKELAXIN Stopped by: Mechele Claude, MD      TAKE these medications   cholecalciferol 1000 units tablet Commonly known as: VITAMIN D Take 2,000 Units by mouth daily.   mometasone 0.1 % cream Commonly known as: Elocon Apply 1 application topically daily. To affected areas Started by: Mechele Claude, MD   pantoprazole 40 MG tablet Commonly known as: PROTONIX Take 1 tablet (40 mg total) by mouth daily. For stomach Started by: Mechele Claude, MD        Follow-up: Return in  about 1 year (around 02/20/2021).  Claretta Fraise, M.D.

## 2020-02-22 LAB — CMP14+EGFR
ALT: 18 IU/L (ref 0–44)
AST: 18 IU/L (ref 0–40)
Albumin/Globulin Ratio: 1.7 (ref 1.2–2.2)
Albumin: 4.8 g/dL (ref 4.0–5.0)
Alkaline Phosphatase: 70 IU/L (ref 48–121)
BUN/Creatinine Ratio: 12 (ref 9–20)
BUN: 12 mg/dL (ref 6–24)
Bilirubin Total: 0.6 mg/dL (ref 0.0–1.2)
CO2: 25 mmol/L (ref 20–29)
Calcium: 10.1 mg/dL (ref 8.7–10.2)
Chloride: 97 mmol/L (ref 96–106)
Creatinine, Ser: 1.01 mg/dL (ref 0.76–1.27)
GFR calc Af Amer: 102 mL/min/{1.73_m2} (ref >59)
GFR calc non Af Amer: 88 mL/min/{1.73_m2} (ref >59)
Globulin, Total: 2.8 g/dL (ref 1.5–4.5)
Glucose: 97 mg/dL (ref 65–99)
Potassium: 4.6 mmol/L (ref 3.5–5.2)
Sodium: 137 mmol/L (ref 134–144)
Total Protein: 7.6 g/dL (ref 6.0–8.5)

## 2020-02-22 LAB — LIPID PANEL
Chol/HDL Ratio: 2.5 ratio (ref 0.0–5.0)
Cholesterol, Total: 175 mg/dL (ref 100–199)
HDL: 69 mg/dL (ref >39)
LDL Chol Calc (NIH): 90 mg/dL (ref 0–99)
Triglycerides: 89 mg/dL (ref 0–149)
VLDL Cholesterol Cal: 16 mg/dL (ref 5–40)

## 2020-02-22 LAB — CBC WITH DIFFERENTIAL/PLATELET
Basophils Absolute: 0 10*3/uL (ref 0.0–0.2)
Basos: 1 %
EOS (ABSOLUTE): 0.2 10*3/uL (ref 0.0–0.4)
Eos: 3 %
Hematocrit: 47.6 % (ref 37.5–51.0)
Hemoglobin: 16.3 g/dL (ref 13.0–17.7)
Immature Grans (Abs): 0 10*3/uL (ref 0.0–0.1)
Immature Granulocytes: 0 %
Lymphocytes Absolute: 1.8 10*3/uL (ref 0.7–3.1)
Lymphs: 37 %
MCH: 29 pg (ref 26.6–33.0)
MCHC: 34.2 g/dL (ref 31.5–35.7)
MCV: 85 fL (ref 79–97)
Monocytes Absolute: 0.3 10*3/uL (ref 0.1–0.9)
Monocytes: 7 %
Neutrophils Absolute: 2.6 10*3/uL (ref 1.4–7.0)
Neutrophils: 52 %
Platelets: 273 10*3/uL (ref 150–450)
RBC: 5.63 x10E6/uL (ref 4.14–5.80)
RDW: 13 % (ref 11.6–15.4)
WBC: 4.9 10*3/uL (ref 3.4–10.8)

## 2020-02-22 LAB — VITAMIN D 25 HYDROXY (VIT D DEFICIENCY, FRACTURES): Vit D, 25-Hydroxy: 44.6 ng/mL (ref 30.0–100.0)

## 2020-02-22 LAB — PSA TOTAL (REFLEX TO FREE): Prostate Specific Ag, Serum: 1 ng/mL (ref 0.0–4.0)

## 2020-02-22 LAB — HEPATITIS C ANTIBODY: Hep C Virus Ab: <0.1 {s_co_ratio} (ref 0.0–0.9)

## 2020-02-22 LAB — HIV ANTIBODY (ROUTINE TESTING W REFLEX): HIV Screen 4th Generation wRfx: NONREACTIVE

## 2020-02-22 NOTE — Progress Notes (Signed)
Hello Clance,  Your lab result is normal and/or stable.Some minor variations that are not significant are commonly marked abnormal, but do not represent any medical problem for you.  Best regards, Marla Pouliot, M.D.

## 2020-03-01 IMAGING — DX DG WRIST COMPLETE 3+V*L*
3 series · 3 of 3 positions shown · non-contrast
Comparison: None.

CLINICAL DATA: Injured left wrist putting a boot on 3 months ago,
pain

EXAM:
LEFT WRIST - COMPLETE 3+ VIEW

[wrist ap]
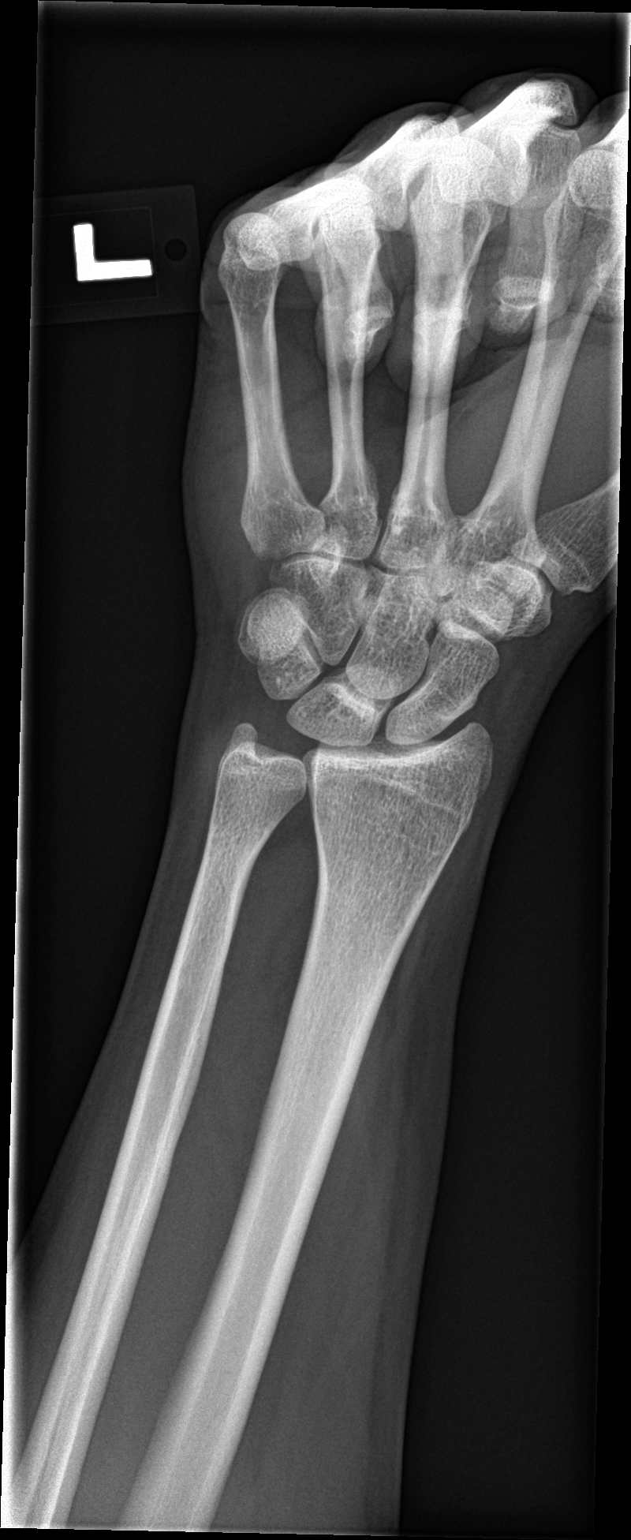

[wrist lat]
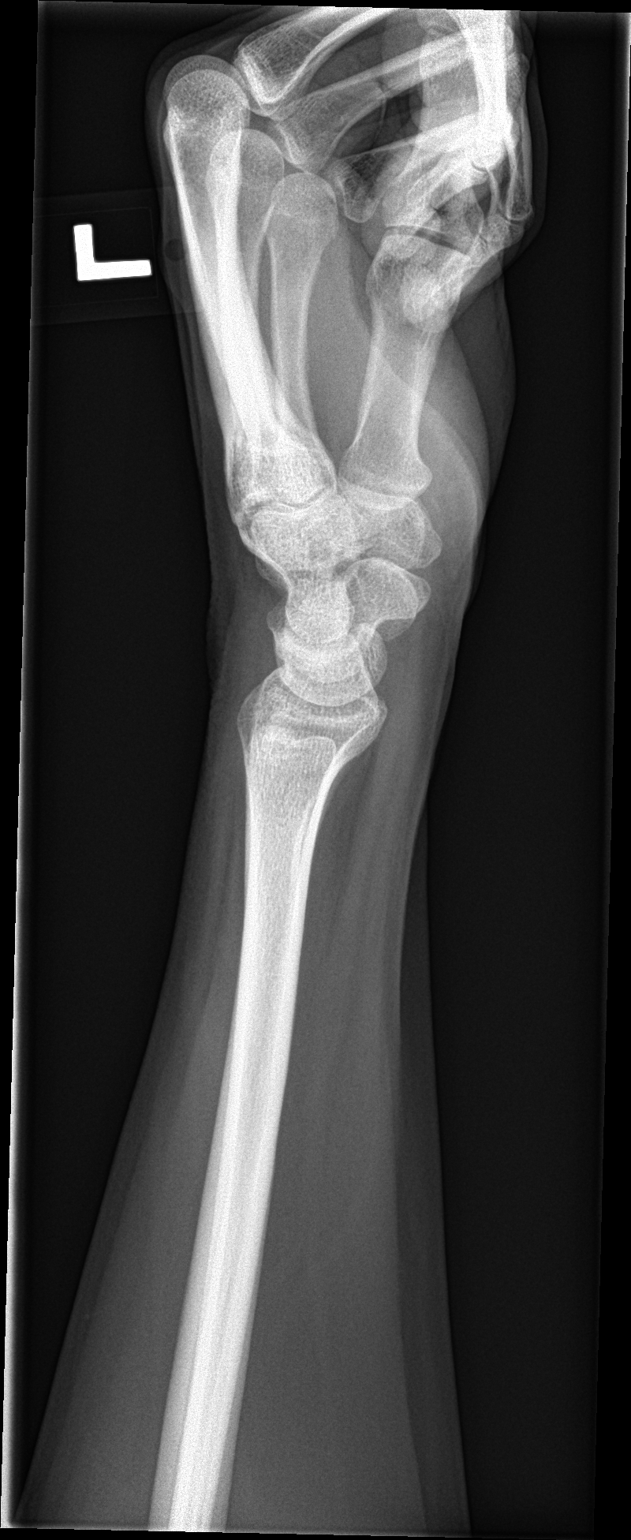

[wrist obl]
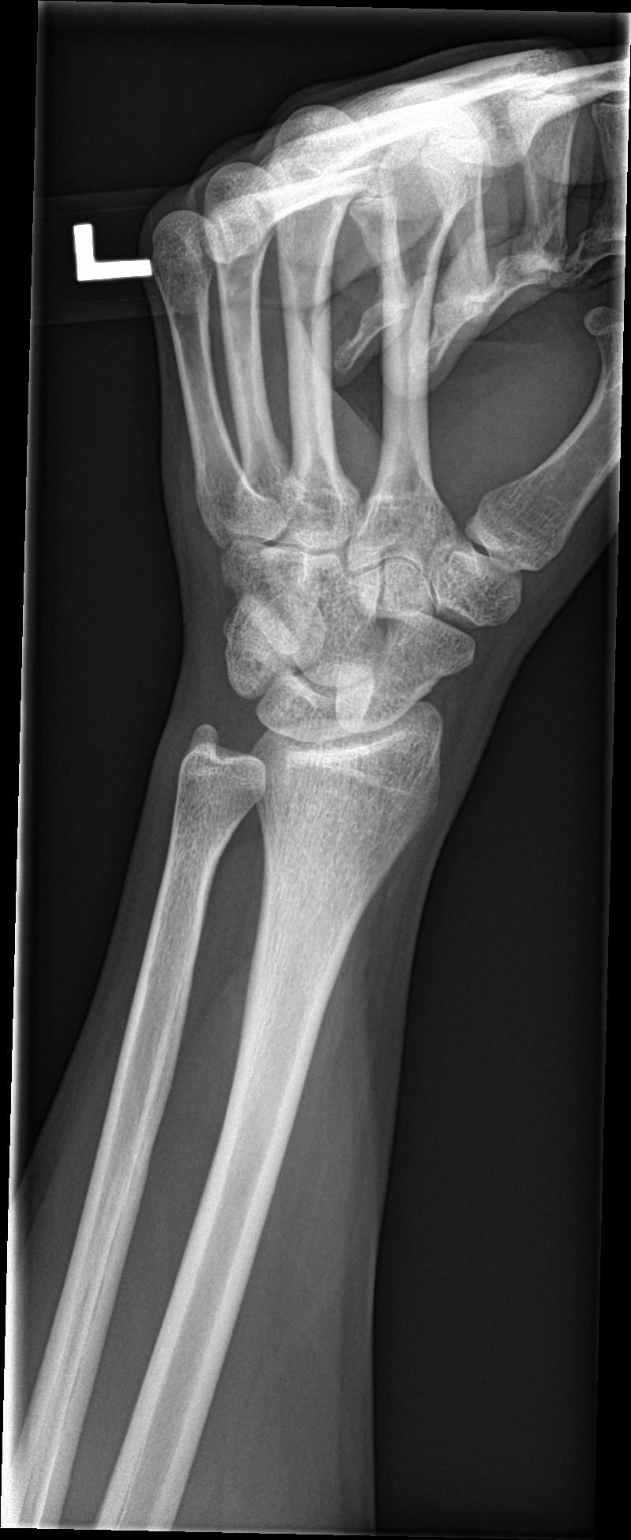

[3 of 3 positions shown; findings below may reference images not displayed]

FINDINGS: The left radiocarpal joint space appears normal and the ulnar
styloid is intact. The carpal bones are normal position with normal
intercarpal joint spaces and normal alignment. No fracture is seen.
IMPRESSION: Negative.

## 2020-03-14 ENCOUNTER — Encounter: Payer: Self-pay | Admitting: Family Medicine

## 2020-03-14 ENCOUNTER — Other Ambulatory Visit: Payer: Self-pay

## 2020-03-14 ENCOUNTER — Ambulatory Visit (INDEPENDENT_AMBULATORY_CARE_PROVIDER_SITE_OTHER): Payer: BC Managed Care – PPO | Admitting: Family Medicine

## 2020-03-14 DIAGNOSIS — J069 Acute upper respiratory infection, unspecified: Secondary | ICD-10-CM | POA: Diagnosis not present

## 2020-03-14 NOTE — Progress Notes (Signed)
    Subjective:    Patient ID: Christopher Maxwell, male    DOB: [birth year], 49 y.o.   MRN: [id number]   HPI: Christopher Maxwell is a 49 y.o. male presenting for aching all over and a little sore throat on the right side. Denies, cough, fever, congestion. No loss of sense of smell or taste. Has an appointment for CoVID testing at [facility]. . Cough is dry.No earache, no productivity of cough. TAking tylenol and an allergy pill. Started after mowing two days ago.    Depression screen [facility] 2/9 02/21/2020 07/29/2019 07/05/2019 02/18/2019 02/17/2018  Decreased Interest 0 0 0 0 0  Down, Depressed, Hopeless 0 0 0 0 0  PHQ - 2 Score 0 0 0 0 0  Altered sleeping - - - 0 -  Tired, decreased energy - - - 0 -  Change in appetite - - - 0 -  Feeling bad or failure about yourself  - - - 0 -  Trouble concentrating - - - 0 -  Moving slowly or fidgety/restless - - - 0 -  Suicidal thoughts - - - 0 -  PHQ-9 Score - - - 0 -     Relevant past medical, surgical, family and social history reviewed and updated as indicated.  Interim medical history since our last visit reviewed. Allergies and medications reviewed and updated.  ROS:  Review of Systems  As in HPI, otherwise noncontributory. Social History   Tobacco Use  Smoking Status Former Smoker  . Types: Cigarettes  . Quit date: 04/15/2011  . Years since quitting: 8.9  Smokeless Tobacco Never Used       Objective:     Wt Readings from Last 3 Encounters:  02/21/20 167 lb 12.8 oz (76.1 kg)  07/29/19 169 lb (76.7 kg)  07/07/19 169 lb 6.4 oz (76.8 kg)     Exam deferred. Pt. Harboring due to COVID 19. Phone visit performed.   Assessment & Plan:   1. Viral upper respiratory tract infection    Patient planning to get a Covid test tomorrow.  He should rest at home until he gets his results back.  At that time he can return to work if they are negative.  If at any point he takes a turn for the worse or his tests come back positive he  should notify me and I will treat as appropriate.    Diagnoses and all orders for this visit:  Viral upper respiratory tract infection    Virtual Visit via telephone Note  I discussed the limitations, risks, security and privacy concerns of performing an evaluation and management service by telephone and the availability of in person appointments. The patient was identified with two identifiers. Pt.expressed understanding and agreed to proceed. Pt. Is at home. Dr. Darlyn Read is in his office.  Follow Up Instructions:   I discussed the assessment and treatment plan with the patient. The patient was provided an opportunity to ask questions and all were answered. The patient agreed with the plan and demonstrated an understanding of the instructions.   The patient was advised to call back or seek an in-person evaluation if the symptoms worsen or if the condition fails to improve as anticipated.   Total minutes including chart review and phone contact time: 9   Follow up plan: No follow-ups on file.  Mechele Claude, MD [place]

## 2020-03-15 DIAGNOSIS — Z20822 Contact with and (suspected) exposure to covid-19: Secondary | ICD-10-CM | POA: Diagnosis not present

## 2020-04-26 DIAGNOSIS — Z23 Encounter for immunization: Secondary | ICD-10-CM | POA: Diagnosis not present

## 2020-06-12 DIAGNOSIS — Z20822 Contact with and (suspected) exposure to covid-19: Secondary | ICD-10-CM | POA: Diagnosis not present

## 2020-06-14 ENCOUNTER — Telehealth: Payer: Self-pay

## 2020-06-15 ENCOUNTER — Other Ambulatory Visit: Payer: Self-pay | Admitting: Nurse Practitioner

## 2020-06-15 DIAGNOSIS — I10 Essential (primary) hypertension: Secondary | ICD-10-CM

## 2020-06-15 DIAGNOSIS — U071 COVID-19: Secondary | ICD-10-CM

## 2020-06-15 NOTE — Progress Notes (Signed)
I connected by phone with [name] on 06/15/2020 at 1:07 PM to discuss the potential use of a new treatment for mild to moderate COVID-19 viral infection in non-hospitalized patients.  This patient is a 49 y.o. male that meets the FDA criteria for Emergency Use Authorization of COVID monoclonal antibody casirivimab/imdevimab, bamlanivimab/eteseviamb, or sotrovimab.  Has a (+) direct SARS-CoV-2 viral test result  Has mild or moderate COVID-19   Is NOT hospitalized due to COVID-19  Is within 10 days of symptom onset  Has at least one of the high risk factor(s) for progression to severe COVID-19 and/or hospitalization as defined in EUA.  Specific high risk criteria : Cardiovascular disease or hypertension   I have spoken and communicated the following to the patient or parent/caregiver regarding COVID monoclonal antibody treatment:  1. FDA has authorized the emergency use for the treatment of mild to moderate COVID-19 in adults and pediatric patients with positive results of direct SARS-CoV-2 viral testing who are 40 years of age and older weighing at least 40 kg, and who are at high risk for progressing to severe COVID-19 and/or hospitalization.  2. The significant known and potential risks and benefits of COVID monoclonal antibody, and the extent to which such potential risks and benefits are unknown.  3. Information on available alternative treatments and the risks and benefits of those alternatives, including clinical trials.  4. Patients treated with COVID monoclonal antibody should continue to self-isolate and use infection control measures (e.g., wear mask, isolate, social distance, avoid sharing personal items, clean and disinfect "high touch" surfaces, and frequent handwashing) according to CDC guidelines.   5. The patient or parent/caregiver has the option to accept or refuse COVID monoclonal antibody treatment.  After reviewing this information with the patient, the  patient has agreed to receive one of the available covid 19 monoclonal antibodies and will be provided an appropriate fact sheet prior to infusion. Mayra Reel, NP 06/15/2020 1:07 PM

## 2020-06-16 ENCOUNTER — Ambulatory Visit (HOSPITAL_COMMUNITY)
Admission: RE | Admit: 2020-06-16 | Discharge: 2020-06-16 | Disposition: A | Discharge status: Home / Self Care | Payer: BC Managed Care – PPO | Source: Ambulatory Visit | Attending: Pulmonary Disease | Admitting: Pulmonary Disease

## 2020-06-16 DIAGNOSIS — U071 COVID-19: Secondary | ICD-10-CM

## 2020-06-16 DIAGNOSIS — I10 Essential (primary) hypertension: Secondary | ICD-10-CM | POA: Diagnosis not present

## 2020-06-16 MED ORDER — ALBUTEROL SULFATE HFA 108 (90 BASE) MCG/ACT IN AERS: 2.0000 | INHALATION_SPRAY | Freq: Once | RESPIRATORY_TRACT | Status: DC | PRN

## 2020-06-16 MED ORDER — SOTROVIMAB 500 MG/8ML IV SOLN
500.0000 mg | Freq: Once | INTRAVENOUS | Status: AC
  Administered 2020-06-16: 500 mg via INTRAVENOUS

## 2020-06-16 MED ORDER — SODIUM CHLORIDE 0.9 % IV SOLN: INTRAVENOUS | Status: DC | PRN

## 2020-06-16 MED ORDER — FAMOTIDINE IN NACL 20-0.9 MG/50ML-% IV SOLN: 20.0000 mg | Freq: Once | INTRAVENOUS | Status: DC | PRN

## 2020-06-16 MED ORDER — METHYLPREDNISOLONE SODIUM SUCC 125 MG IJ SOLR: 125.0000 mg | Freq: Once | INTRAMUSCULAR | Status: DC | PRN

## 2020-06-16 MED ORDER — DIPHENHYDRAMINE HCL 50 MG/ML IJ SOLN: 50.0000 mg | Freq: Once | INTRAMUSCULAR | Status: DC | PRN

## 2020-06-16 MED ORDER — EPINEPHRINE 0.3 MG/0.3ML IJ SOAJ: 0.3000 mg | Freq: Once | INTRAMUSCULAR | Status: DC | PRN

## 2020-06-16 NOTE — Discharge Instructions (Signed)
10 Things You Can Do to Manage Your COVID-19 Symptoms at Home If you have possible or confirmed COVID-19: 1. Stay home from work and school. And stay away from other public places. If you must go out, avoid using any kind of public transportation, ridesharing, or taxis. 2. Monitor your symptoms carefully. If your symptoms get worse, call your healthcare provider immediately. 3. Get rest and stay hydrated. 4. If you have a medical appointment, call the healthcare provider ahead of time and tell them that you have or may have COVID-19. 5. For medical emergencies, call 911 and notify the dispatch personnel that you have or may have COVID-19. 6. Cover your cough and sneezes with a tissue or use the inside of your elbow. 7. Wash your hands often with soap and water for at least 20 seconds or clean your hands with an alcohol-based hand sanitizer that contains at least 60% alcohol. 8. As much as possible, stay in a specific room and away from other people in your home. Also, you should use a separate bathroom, if available. If you need to be around other people in or outside of the home, wear a mask. 9. Avoid sharing personal items with other people in your household, like dishes, towels, and bedding. 10. Clean all surfaces that are touched often, like counters, tabletops, and doorknobs. Use household cleaning sprays or wipes according to the label instructions. cdc.gov/coronavirus 01/13/2019 This information is not intended to replace advice given to you by your health care provider. Make sure you discuss any questions you have with your health care provider. Document Revised: 06/17/2019 Document Reviewed: 06/17/2019 Elsevier Patient Education  2020 Elsevier Inc. What types of side effects do monoclonal antibody drugs cause?  Common side effects  In general, the more common side effects caused by monoclonal antibody drugs include: . Allergic reactions, such as hives or itching . Flu-like signs and  symptoms, including chills, fatigue, fever, and muscle aches and pains . Nausea, vomiting . Diarrhea . Skin rashes . Low blood pressure   The CDC is recommending patients who receive monoclonal antibody treatments wait at least 90 days before being vaccinated.  Currently, there are no data on the safety and efficacy of mRNA COVID-19 vaccines in persons who received monoclonal antibodies or convalescent plasma as part of COVID-19 treatment. Based on the estimated half-life of such therapies as well as evidence suggesting that reinfection is uncommon in the 90 days after initial infection, vaccination should be deferred for at least 90 days, as a precautionary measure until additional information becomes available, to avoid interference of the antibody treatment with vaccine-induced immune responses. If you have any questions or concerns after the infusion please call the Advanced Practice Provider on call at [phone]. This number is ONLY intended for your use regarding questions or concerns about the infusion post-treatment side-effects.  Please do not provide this number to others for use. For return to work notes please contact your primary care provider.   If someone you know is interested in receiving treatment please have them call the COVID hotline at [phone].   

## 2020-06-16 NOTE — Progress Notes (Signed)
Patient reviewed Fact Sheet for Patients, Parents, and Caregivers for Emergency Use Authorization (EUA) of Sotrovimab for the Treatment of Coronavirus. Patient also reviewed and is agreeable to the estimated cost of treatment. Patient is agreeable to proceed.   

## 2020-06-16 NOTE — Telephone Encounter (Signed)
What are his current symptoms?

## 2020-06-16 NOTE — Telephone Encounter (Signed)
LMTCB

## 2020-06-16 NOTE — Progress Notes (Signed)
Diagnosis: COVID-19  Physician: Dr. Patrick Wright  Procedure: Covid Infusion Clinic Med: Sotrovimab infusion - Provided patient with sotrovimab fact sheet for patients, parents, and caregivers prior to infusion.   Complications: No immediate complications noted  Discharge: Discharged home    

## 2020-07-07 DIAGNOSIS — N289 Disorder of kidney and ureter, unspecified: Secondary | ICD-10-CM | POA: Diagnosis not present

## 2020-07-07 DIAGNOSIS — Z87891 Personal history of nicotine dependence: Secondary | ICD-10-CM | POA: Diagnosis not present

## 2020-07-07 DIAGNOSIS — K449 Diaphragmatic hernia without obstruction or gangrene: Secondary | ICD-10-CM | POA: Diagnosis not present

## 2020-07-07 DIAGNOSIS — R600 Localized edema: Secondary | ICD-10-CM | POA: Diagnosis not present

## 2020-07-07 DIAGNOSIS — S3991XA Unspecified injury of abdomen, initial encounter: Secondary | ICD-10-CM | POA: Diagnosis not present

## 2020-07-07 DIAGNOSIS — M419 Scoliosis, unspecified: Secondary | ICD-10-CM | POA: Diagnosis not present

## 2020-07-07 DIAGNOSIS — W0110XA Fall on same level from slipping, tripping and stumbling with subsequent striking against unspecified object, initial encounter: Secondary | ICD-10-CM | POA: Diagnosis not present

## 2020-07-07 DIAGNOSIS — S51801A Unspecified open wound of right forearm, initial encounter: Secondary | ICD-10-CM | POA: Diagnosis not present

## 2020-07-07 DIAGNOSIS — S2241XA Multiple fractures of ribs, right side, initial encounter for closed fracture: Secondary | ICD-10-CM | POA: Diagnosis not present

## 2020-07-07 DIAGNOSIS — M25511 Pain in right shoulder: Secondary | ICD-10-CM | POA: Diagnosis not present

## 2020-07-07 DIAGNOSIS — S51811A Laceration without foreign body of right forearm, initial encounter: Secondary | ICD-10-CM | POA: Diagnosis not present

## 2020-07-07 DIAGNOSIS — S41111A Laceration without foreign body of right upper arm, initial encounter: Secondary | ICD-10-CM | POA: Diagnosis not present

## 2020-07-07 DIAGNOSIS — S51831A Puncture wound without foreign body of right forearm, initial encounter: Secondary | ICD-10-CM | POA: Diagnosis not present

## 2020-07-07 DIAGNOSIS — K7689 Other specified diseases of liver: Secondary | ICD-10-CM | POA: Diagnosis not present

## 2020-07-09 DIAGNOSIS — Z87891 Personal history of nicotine dependence: Secondary | ICD-10-CM | POA: Diagnosis not present

## 2020-07-09 DIAGNOSIS — Z4800 Encounter for change or removal of nonsurgical wound dressing: Secondary | ICD-10-CM | POA: Diagnosis not present

## 2020-07-09 DIAGNOSIS — W19XXXD Unspecified fall, subsequent encounter: Secondary | ICD-10-CM | POA: Diagnosis not present

## 2020-07-09 DIAGNOSIS — S2241XD Multiple fractures of ribs, right side, subsequent encounter for fracture with routine healing: Secondary | ICD-10-CM | POA: Diagnosis not present

## 2020-07-09 DIAGNOSIS — S2241XA Multiple fractures of ribs, right side, initial encounter for closed fracture: Secondary | ICD-10-CM | POA: Diagnosis not present

## 2020-07-12 ENCOUNTER — Encounter: Payer: Self-pay | Admitting: Family Medicine

## 2020-07-12 ENCOUNTER — Ambulatory Visit: Payer: BC Managed Care – PPO | Admitting: Family Medicine

## 2020-07-12 ENCOUNTER — Other Ambulatory Visit: Payer: Self-pay

## 2020-07-12 VITALS — BP 136/89 | HR 89 | Temp 98.8°F | Ht 73.0 in | Wt 172.8 lb

## 2020-07-12 DIAGNOSIS — S2231XA Fracture of one rib, right side, initial encounter for closed fracture: Secondary | ICD-10-CM

## 2020-07-12 NOTE — Progress Notes (Signed)
Chief Complaint  Patient presents with  . Hospitalization Follow-up    Had fall christmas eve wound check on arm. Hurt ribs [facility]    HPI  Patient presents today for patient fell off of a 3 step ladder 5 days ago.  He hurt his ribs and went to [facility].  While there he was evaluated with a CT scan that showed fractures of the right posterior ribs 8 9 and 10.  These were considered displaced.  Currently he denies shortness of breath although they are very painful.  He wonders when he can go back to work.  PMH: Smoking status noted ROS: Per HPI  Objective: BP 136/89   Pulse 89   Temp 98.8 F (37.1 C) (Temporal)   Ht 6\' 1"  (1.854 m)   Wt 172 lb 12.8 oz (78.4 kg)   BMI 22.80 kg/m  Gen: NAD, alert, cooperative with exam HEENT: NCAT, EOMI, PERRL CV: RRR, good S1/S2, no murmur Resp: CTABL, no wheezes, non-labored.  There is tenderness at the lower right posterior ribs.  This is from minimal palpation.  There is some bruising distally shows yellow changes but no sign of inflammation or infection.  Rib fracture displaced Ext: No edema, warm Neuro: Alert and oriented, No gross deficits  Assessment and plan:  1. Closed traumatic displaced fracture of rib on right side, initial encounter     We discussed healing and the critical importance of protecting a displaced rib fracture, much less 3 of the same.  He must return to work.  As a result I will try to work him in in about a week to see if he is ready.  However due to the sensitive nature of his injury I am reluctant to release him yet.    Follow up as needed.  , MD

## 2020-07-15 DIAGNOSIS — M7710 Lateral epicondylitis, unspecified elbow: Secondary | ICD-10-CM

## 2020-07-15 HISTORY — DX: Lateral epicondylitis, unspecified elbow: M77.10

## 2020-07-18 ENCOUNTER — Ambulatory Visit (INDEPENDENT_AMBULATORY_CARE_PROVIDER_SITE_OTHER): Payer: BC Managed Care – PPO

## 2020-07-18 ENCOUNTER — Ambulatory Visit: Payer: BC Managed Care – PPO | Admitting: Family Medicine

## 2020-07-18 ENCOUNTER — Encounter: Payer: Self-pay | Admitting: Family Medicine

## 2020-07-18 ENCOUNTER — Other Ambulatory Visit: Payer: Self-pay

## 2020-07-18 VITALS — BP 138/82 | HR 101 | Temp 97.2°F | Ht 73.0 in | Wt 169.0 lb

## 2020-07-18 DIAGNOSIS — S2231XD Fracture of one rib, right side, subsequent encounter for fracture with routine healing: Secondary | ICD-10-CM | POA: Diagnosis not present

## 2020-07-18 DIAGNOSIS — M4120 Other idiopathic scoliosis, site unspecified: Secondary | ICD-10-CM

## 2020-07-18 DIAGNOSIS — S2241XD Multiple fractures of ribs, right side, subsequent encounter for fracture with routine healing: Secondary | ICD-10-CM | POA: Diagnosis not present

## 2020-07-18 NOTE — Progress Notes (Signed)
Chief Complaint  Patient presents with  . Wound Check    Right elbow     HPI  Patient presents today for staple removal from the right forearm lesion.  Recheck of the ribs.  He suffered fractures with a fall on Christmas Eve.  He says that the fractured ribs are sore and uncomfortable.  He sneezed a couple days ago and the pain was sudden and excruciating but resolved fairly quickly.  He is staying out of work through the end of this week.  We are to discuss whether that is long enough as part of today's visit.  I have noted that he is taking ibuprofen 400 to 600 mg at bedtime and that is his only medication for pain currently.  The staples at the midshaft of the right forearm are to be evaluated for removal today.  He denies pain discomfort signs or symptoms of infection.  PMH: Smoking status noted HYI:FOYDXA of Systems  Constitutional: Positive for activity change. Negative for fatigue and fever.  HENT: Negative.   Respiratory: Negative for shortness of breath.   Cardiovascular: Positive for chest pain (posterior right ). Negative for leg swelling.    Objective: BP 138/82   Pulse (!) 101   Temp (!) 97.2 F (36.2 C) (Temporal)   Ht 6\' 1"  (1.854 m)   Wt 169 lb (76.7 kg)   BMI 22.30 kg/m  Gen: NAD, alert, cooperative with exam HEENT: NCAT, EOMI, PERRL CV: RRR, good S1/S2, no murmur.  Moderate tenderness at the posterior axillary line on the right at the ninth 10th and 11th rib level. Resp: CTABL, no wheezes, non-labored Ext: No edema, warm.  The right forearm has 3 staples in the laceration previously treated.  There is no erythema healing looks good there is minimal tenderness.  The sutures were removed without difficulty.  Simple dressing applied. Neuro: Alert and oriented, No gross deficits Rib x-ray shows the current fractures.  He does have significant scoliosis as well.  There is some elevation of the right hemidiaphragm.  Comparison to old films makes it hard to say whether  there is any acute change. Assessment and plan:  1. Closed traumatic displaced fracture of rib of right side with routine healing, subsequent encounter   2. Other idiopathic scoliosis, unspecified spinal region     We discussed return to work.  I had like to keep him out of another week.  I am concerned about potential for laceration of the liver and or puncture of the lung should he undergo any kind of injury.  Like him to protect this is much as possible.  Orders Placed This Encounter  Procedures  . DG Ribs Unilateral W/Chest Right    Standing Status:   Future    Number of Occurrences:   1    Standing Expiration Date:   08/18/2020    Order Specific Question:   Reason for Exam (SYMPTOM  OR DIAGNOSIS REQUIRED)    Answer:   f/u of fx    Order Specific Question:   Preferred imaging location?    Answer:   Internal    Follow up as needed.  10/16/2020, MD

## 2020-07-26 ENCOUNTER — Other Ambulatory Visit: Payer: Self-pay

## 2020-07-26 ENCOUNTER — Encounter: Payer: Self-pay | Admitting: Family Medicine

## 2020-07-26 ENCOUNTER — Ambulatory Visit (INDEPENDENT_AMBULATORY_CARE_PROVIDER_SITE_OTHER): Payer: BC Managed Care – PPO

## 2020-07-26 ENCOUNTER — Ambulatory Visit: Payer: BC Managed Care – PPO | Admitting: Family Medicine

## 2020-07-26 VITALS — BP 135/89 | HR 87 | Temp 97.6°F | Resp 20 | Ht 73.0 in | Wt 168.1 lb

## 2020-07-26 DIAGNOSIS — S2231XD Fracture of one rib, right side, subsequent encounter for fracture with routine healing: Secondary | ICD-10-CM

## 2020-07-26 DIAGNOSIS — I10 Essential (primary) hypertension: Secondary | ICD-10-CM

## 2020-07-26 DIAGNOSIS — M4120 Other idiopathic scoliosis, site unspecified: Secondary | ICD-10-CM

## 2020-07-26 DIAGNOSIS — S2241XA Multiple fractures of ribs, right side, initial encounter for closed fracture: Secondary | ICD-10-CM | POA: Diagnosis not present

## 2020-07-26 NOTE — Progress Notes (Signed)
Subjective:  Patient ID: Christopher Maxwell, male    DOB: February 05, 1971  Age: 50 y.o. 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  When he was here last the x-ray showed that the displacement was actually worsening.  Today he says that he is still having some pain but he gets by with 2 doses of ibuprofen a day.  He is working from home to avoid any potential injury that could cause the displaced ribs to puncture the lungs or liver.  The fractures have been noted on the right ninth and 10th rib posteriorly at the level of the liver.  Depression screen [facility] 2/9 07/26/2020 07/18/2020 07/12/2020  Decreased Interest 0 0 0  Down, Depressed, Hopeless 0 0 0  PHQ - 2 Score 0 0 0  Altered sleeping - - -  Tired, decreased energy - - -  Change in appetite - - -  Feeling bad or failure about yourself  - - -  Trouble concentrating - - -  Moving slowly or fidgety/restless - - -  Suicidal thoughts - - -  PHQ-9 Score - - -    History [name] has a past medical history of Essential hypertension (07/28/2019) and Plantar fasciitis, bilateral.   He has a past surgical history that includes Wisdom tooth extraction and Vasectomy.   His family history includes Cancer in his mother; Diabetes in his father.He reports that he quit smoking about 9 years ago. His smoking use included cigarettes. He has never used smokeless tobacco. He reports current alcohol use of about 2.0 standard drinks of alcohol per week. He reports that he does not use drugs.    ROS Review of Systems  Constitutional: Negative for fatigue and fever.  Respiratory: Negative for cough and shortness of breath.     Objective:  BP 135/89   Pulse 87   Temp 97.6 F (36.4 C) (Temporal)   Resp 20   Ht 6\' 1"  (1.854 m)   Wt 168 lb 2 oz (76.3 kg)   SpO2 99%   BMI 22.18 kg/m   BP Readings from Last 3 Encounters:  07/26/20 135/89  07/18/20 138/82  07/12/20 136/89    Wt  Readings from Last 3 Encounters:  07/26/20 168 lb 2 oz (76.3 kg)  07/18/20 169 lb (76.7 kg)  07/12/20 172 lb 12.8 oz (78.4 kg)     Physical Exam Vitals reviewed.  Constitutional:      Appearance: He is well-developed and well-nourished.  HENT:     Head: Normocephalic and atraumatic.     Right Ear: External ear normal.     Left Ear: External ear normal.     Mouth/Throat:     Pharynx: No oropharyngeal exudate or posterior oropharyngeal erythema.  Eyes:     Pupils: Pupils are equal, round, and reactive to light.  Cardiovascular:     Rate and Rhythm: Normal rate and regular rhythm.     Heart sounds: No murmur heard.   Pulmonary:     Effort: No respiratory distress.     Breath sounds: Normal breath sounds.  Musculoskeletal:        General: Deformity (Moderately severe scoliosis) present.     Cervical back: Normal range of motion and neck supple.  Neurological:     Mental Status: He is alert and oriented to person, place, and time.    Right ribs x-ray: There is early callus formation versus periosteal reaction.  The displacement seems to be somewhat decreased at this time.   Assessment & Plan:   Amiir was seen today for fractured ribs follow up.  Diagnoses and all orders for this visit:  Closed traumatic displaced fracture of rib of right side with routine healing, subsequent encounter -     DG Ribs Unilateral W/Chest Right; Future  Essential hypertension  Other idiopathic scoliosis, unspecified spinal region       I have discontinued Christiane Ha B. Worst's clindamycin and oxyCODONE-acetaminophen. I am also having him maintain his cholecalciferol, mometasone, and pantoprazole.  Allergies as of 07/26/2020      Reactions   Azithromycin Other (See Comments)   Hypothermia, diaphoresis, weakness   Penicillins       Medication List       Accurate as of July 26, 2020  8:59 PM. If you have any questions, ask your nurse or doctor.        STOP taking these  medications   clindamycin 150 MG capsule Commonly known as: CLEOCIN Stopped by: Mechele Claude, MD   oxyCODONE-acetaminophen 5-325 MG tablet Commonly known as: PERCOCET/ROXICET Stopped by: Mechele Claude, MD     TAKE these medications   cholecalciferol 1000 units tablet Commonly known as: VITAMIN D Take 2,000 Units by mouth daily.   mometasone 0.1 % cream Commonly known as: Elocon Apply 1 application topically daily. To affected areas   pantoprazole 40 MG tablet Commonly known as: PROTONIX Take 1 tablet (40 mg total) by mouth daily. For stomach      I think the patient can safely resume normal activities as long as he continues to protect himself over the next several weeks from potential trauma.  Return to work note for January 18 was printed for him.  Follow-up: Return in about 6 months (around 01/23/2021), or if symptoms worsen or fail to improve.  Mechele Claude, M.D.

## 2020-09-18 ENCOUNTER — Encounter: Payer: Self-pay | Admitting: Family Medicine

## 2020-09-18 ENCOUNTER — Other Ambulatory Visit: Payer: Self-pay

## 2020-09-18 ENCOUNTER — Ambulatory Visit (INDEPENDENT_AMBULATORY_CARE_PROVIDER_SITE_OTHER): Payer: BC Managed Care – PPO

## 2020-09-18 ENCOUNTER — Ambulatory Visit: Payer: BC Managed Care – PPO | Admitting: Family Medicine

## 2020-09-18 VITALS — BP 120/76 | HR 93 | Temp 98.1°F | Ht 73.0 in | Wt 167.4 lb

## 2020-09-18 DIAGNOSIS — S2241XA Multiple fractures of ribs, right side, initial encounter for closed fracture: Secondary | ICD-10-CM | POA: Diagnosis not present

## 2020-09-18 DIAGNOSIS — S2231XD Fracture of one rib, right side, subsequent encounter for fracture with routine healing: Secondary | ICD-10-CM | POA: Diagnosis not present

## 2020-09-18 NOTE — Progress Notes (Signed)
Chief Complaint  Patient presents with  . Chest Pain    Right sided rib pain s/p injury 07/07/2020     HPI  Patient presents today for recheck of his recent rib fracture.  He is back to most normal activities but avoiding heavy lifting etc.  He has some numbness and a bit of an ache but pain is much improved from his previous visit.  He denies shortness of breath.  He denies abdominal pain.  No hematochezia or hematemesis.  PMH: Smoking status noted ROS: Per HPI  Objective: BP 120/76   Pulse 93   Temp 98.1 F (36.7 C)   Ht $R'6\' 1"'qB$  (1.854 m)   Wt 167 lb 6.4 oz (75.9 kg)   SpO2 97%   BMI 22.09 kg/m  Gen: NAD, alert, cooperative with exam HEENT: NCAT, EOMI, PERRL CV: RRR, good S1/S2, no murmur Resp: CTABL, no wheezes, non-labored Abd: SNTND, BS present, no guarding or organomegaly Ext: No edema, warm Neuro: Alert and oriented, No gross deficits  Assessment and plan:  1. Closed traumatic displaced fracture of rib of right side with routine healing, subsequent encounter     He can resume normal activities.  The rib x-ray shows that there is adequate healing.  Second opinion of course from radiology is pending.  Orders Placed This Encounter  Procedures  . DG Ribs Unilateral W/Chest Right    Standing Status:   Future    Number of Occurrences:   1    Standing Expiration Date:   10/19/2020    Order Specific Question:   Reason for Exam (SYMPTOM  OR DIAGNOSIS REQUIRED)    Answer:   fracture follow up    Order Specific Question:   Preferred imaging location?    Answer:   Internal  . CMP14+EGFR    Order Specific Question:   Has the patient fasted?    Answer:   Yes    Order Specific Question:   Release to patient    Answer:   Immediate    Follow up as needed.  Claretta Fraise, MD

## 2020-09-19 LAB — CMP14+EGFR
ALT: 46 IU/L — ABNORMAL HIGH (ref 0–44)
AST: 22 IU/L (ref 0–40)
Albumin/Globulin Ratio: 2 (ref 1.2–2.2)
Albumin: 4.9 g/dL (ref 4.0–5.0)
Alkaline Phosphatase: 77 IU/L (ref 44–121)
BUN/Creatinine Ratio: 16 (ref 9–20)
BUN: 15 mg/dL (ref 6–24)
Bilirubin Total: 0.3 mg/dL (ref 0.0–1.2)
CO2: 26 mmol/L (ref 20–29)
Calcium: 10 mg/dL (ref 8.7–10.2)
Chloride: 100 mmol/L (ref 96–106)
Creatinine, Ser: 0.93 mg/dL (ref 0.76–1.27)
Globulin, Total: 2.4 g/dL (ref 1.5–4.5)
Glucose: 105 mg/dL — ABNORMAL HIGH (ref 65–99)
Potassium: 4.2 mmol/L (ref 3.5–5.2)
Sodium: 138 mmol/L (ref 134–144)
Total Protein: 7.3 g/dL (ref 6.0–8.5)
eGFR: 101 mL/min/{1.73_m2} (ref >59)

## 2020-09-19 NOTE — Progress Notes (Signed)
Hello Damarkus,  Your lab result is normal and/or stable.Some minor variations that are not significant are commonly marked abnormal, but do not represent any medical problem for you.  Best regards, Warren Stacks, M.D.

## 2021-01-11 DIAGNOSIS — M9904 Segmental and somatic dysfunction of sacral region: Secondary | ICD-10-CM | POA: Diagnosis not present

## 2021-01-11 DIAGNOSIS — M62830 Muscle spasm of back: Secondary | ICD-10-CM | POA: Diagnosis not present

## 2021-01-11 DIAGNOSIS — M9903 Segmental and somatic dysfunction of lumbar region: Secondary | ICD-10-CM | POA: Diagnosis not present

## 2021-01-11 DIAGNOSIS — M9902 Segmental and somatic dysfunction of thoracic region: Secondary | ICD-10-CM | POA: Diagnosis not present

## 2021-01-18 DIAGNOSIS — M9903 Segmental and somatic dysfunction of lumbar region: Secondary | ICD-10-CM | POA: Diagnosis not present

## 2021-01-18 DIAGNOSIS — M62830 Muscle spasm of back: Secondary | ICD-10-CM | POA: Diagnosis not present

## 2021-01-18 DIAGNOSIS — M9904 Segmental and somatic dysfunction of sacral region: Secondary | ICD-10-CM | POA: Diagnosis not present

## 2021-01-18 DIAGNOSIS — M9902 Segmental and somatic dysfunction of thoracic region: Secondary | ICD-10-CM | POA: Diagnosis not present

## 2021-01-25 DIAGNOSIS — M9904 Segmental and somatic dysfunction of sacral region: Secondary | ICD-10-CM | POA: Diagnosis not present

## 2021-01-25 DIAGNOSIS — M62830 Muscle spasm of back: Secondary | ICD-10-CM | POA: Diagnosis not present

## 2021-01-25 DIAGNOSIS — M9902 Segmental and somatic dysfunction of thoracic region: Secondary | ICD-10-CM | POA: Diagnosis not present

## 2021-01-25 DIAGNOSIS — M9903 Segmental and somatic dysfunction of lumbar region: Secondary | ICD-10-CM | POA: Diagnosis not present

## 2021-02-01 DIAGNOSIS — M62830 Muscle spasm of back: Secondary | ICD-10-CM | POA: Diagnosis not present

## 2021-02-01 DIAGNOSIS — M9904 Segmental and somatic dysfunction of sacral region: Secondary | ICD-10-CM | POA: Diagnosis not present

## 2021-02-01 DIAGNOSIS — M9902 Segmental and somatic dysfunction of thoracic region: Secondary | ICD-10-CM | POA: Diagnosis not present

## 2021-02-01 DIAGNOSIS — M9903 Segmental and somatic dysfunction of lumbar region: Secondary | ICD-10-CM | POA: Diagnosis not present

## 2021-02-07 ENCOUNTER — Encounter: Payer: Self-pay | Admitting: Family Medicine

## 2021-02-07 ENCOUNTER — Other Ambulatory Visit: Payer: Self-pay

## 2021-02-07 ENCOUNTER — Ambulatory Visit: Payer: BC Managed Care – PPO | Admitting: Family Medicine

## 2021-02-07 VITALS — BP 113/69 | HR 86 | Temp 98.5°F | Ht 73.0 in | Wt 164.8 lb

## 2021-02-07 DIAGNOSIS — M9902 Segmental and somatic dysfunction of thoracic region: Secondary | ICD-10-CM | POA: Diagnosis not present

## 2021-02-07 DIAGNOSIS — M778 Other enthesopathies, not elsewhere classified: Secondary | ICD-10-CM

## 2021-02-07 DIAGNOSIS — M9903 Segmental and somatic dysfunction of lumbar region: Secondary | ICD-10-CM | POA: Diagnosis not present

## 2021-02-07 DIAGNOSIS — M9904 Segmental and somatic dysfunction of sacral region: Secondary | ICD-10-CM | POA: Diagnosis not present

## 2021-02-07 DIAGNOSIS — M62830 Muscle spasm of back: Secondary | ICD-10-CM | POA: Diagnosis not present

## 2021-02-07 MED ORDER — PREDNISONE 10 MG PO TABS: ORAL_TABLET | ORAL | 0 refills | Status: DC

## 2021-02-07 NOTE — Progress Notes (Signed)
Subjective:  Patient ID: Christopher Maxwell, male    DOB: 1970/08/08  Age: 50 y.o. MRN: [id number]  CC: Elbow Pain   HPI Christopher Maxwell presents for pain at right elbow. Onset with moving and lifting. NKI. Onset 1 month ago. Nurse at work gave him a Christopher, a topical cream, but pain persists.   Depression screen [facility] 2/9 02/07/2021 09/18/2020 07/26/2020  Decreased Interest 0 0 0  Down, Depressed, Hopeless 0 0 0  PHQ - 2 Score 0 0 0  Altered sleeping - - -  Tired, decreased energy - - -  Change in appetite - - -  Feeling bad or failure about yourself  - - -  Trouble concentrating - - -  Moving slowly or fidgety/restless - - -  Suicidal thoughts - - -  PHQ-9 Score - - -    History Christopher Maxwell has a past medical history of Essential hypertension (07/28/2019) and Plantar fasciitis, bilateral.   He has a past surgical history that includes Wisdom tooth extraction and Vasectomy.   His family history includes Cancer in his mother; Diabetes in his father.He reports that he quit smoking about 9 years ago. His smoking use included cigarettes. He has never used smokeless tobacco. He reports current alcohol use of about 2.0 standard drinks of alcohol per week. He reports that he does not use drugs.    ROS Review of Systems  Constitutional:  Positive for activity change. Negative for fatigue and fever.   Objective:  BP 113/69   Pulse 86   Temp 98.5 F (36.9 C)   Ht 6\' 1"  (1.854 m)   Wt 164 lb 12.8 oz (74.8 kg)   SpO2 96%   BMI 21.74 kg/m   BP Readings from Last 3 Encounters:  02/07/21 113/69  09/18/20 120/76  07/26/20 135/89    Wt Readings from Last 3 Encounters:  02/07/21 164 lb 12.8 oz (74.8 kg)  09/18/20 167 lb 6.4 oz (75.9 kg)  07/26/20 168 lb 2 oz (76.3 kg)     Physical Exam Vitals reviewed.  Constitutional:      Appearance: He is well-developed.  HENT:     Head: Normocephalic and atraumatic.     Right Ear: External ear normal.     Left Ear: External ear normal.      Mouth/Throat:     Pharynx: No oropharyngeal exudate or posterior oropharyngeal erythema.  Eyes:     Pupils: Pupils are equal, round, and reactive to light.  Cardiovascular:     Rate and Rhythm: Normal rate and regular rhythm.     Heart sounds: No murmur heard. Pulmonary:     Effort: No respiratory distress.     Breath sounds: Normal breath sounds.  Musculoskeletal:        General: Tenderness (right extensor origin at elbow. With painful resisted extension) present.     Cervical back: Normal range of motion and neck supple.  Neurological:     Mental Status: He is alert and oriented to person, place, and time.      Assessment & Plan:   Christopher Maxwell was seen today for elbow pain.  Diagnoses and all orders for this visit:  Extensor carpi ulnaris tendinitis -     For home use only DME Other see comment  Other orders -     predniSONE (DELTASONE) 10 MG tablet; Take 5 daily for 3 days followed by 4,3,2 and 1 for 3 days each.      I am having Christopher B.  Maxwell start on predniSONE. I am also having him maintain his cholecalciferol, mometasone, pantoprazole, diclofenac Sodium, meloxicam, and metaxalone.  Allergies as of 02/07/2021       Reactions   Azithromycin Other (See Comments)   Hypothermia, diaphoresis, weakness   Penicillins         Medication List        Accurate as of February 07, 2021 11:59 PM. If you have any questions, ask your nurse or doctor.          cholecalciferol 1000 units tablet Commonly known as: VITAMIN D Take 2,000 Units by mouth daily.   diclofenac Sodium 1 % Gel Commonly known as: VOLTAREN Apply 2 grams to affected elbow   meloxicam 15 MG tablet Commonly known as: MOBIC 1 tablet   metaxalone 800 MG tablet Commonly known as: SKELAXIN 1 tablet   mometasone 0.1 % cream Commonly known as: Elocon Apply 1 application topically daily. To affected areas   pantoprazole 40 MG tablet Commonly known as: PROTONIX Take 1 tablet (40 mg total)  by mouth daily. For stomach   predniSONE 10 MG tablet Commonly known as: DELTASONE Take 5 daily for 3 days followed by 4,3,2 and 1 for 3 days each. Started by: Mechele Claude, MD               Durable Medical Equipment  (From admission, onward)           Start     Ordered   02/07/21 0000  For home use only DME Other see comment       Comments: DX: M77.8 Right elbow Christopher, hinged  Question:  Length of Need  Answer:  Lifetime   02/07/21 1606             Follow-up: Return if symptoms worsen or fail to improve.  Mechele Claude, M.D.

## 2021-02-10 ENCOUNTER — Encounter: Payer: Self-pay | Admitting: Family Medicine

## 2021-02-12 ENCOUNTER — Encounter: Payer: Self-pay | Admitting: Family Medicine

## 2021-02-26 ENCOUNTER — Other Ambulatory Visit: Payer: Self-pay

## 2021-02-26 ENCOUNTER — Ambulatory Visit (INDEPENDENT_AMBULATORY_CARE_PROVIDER_SITE_OTHER): Payer: BC Managed Care – PPO | Admitting: Family Medicine

## 2021-02-26 ENCOUNTER — Ambulatory Visit (INDEPENDENT_AMBULATORY_CARE_PROVIDER_SITE_OTHER): Payer: BC Managed Care – PPO

## 2021-02-26 ENCOUNTER — Encounter: Payer: Self-pay | Admitting: Family Medicine

## 2021-02-26 VITALS — BP 112/76 | HR 84 | Temp 98.0°F | Ht 73.0 in | Wt 164.0 lb

## 2021-02-26 DIAGNOSIS — Z0001 Encounter for general adult medical examination with abnormal findings: Secondary | ICD-10-CM

## 2021-02-26 DIAGNOSIS — Z0000 Encounter for general adult medical examination without abnormal findings: Secondary | ICD-10-CM

## 2021-02-26 DIAGNOSIS — I10 Essential (primary) hypertension: Secondary | ICD-10-CM

## 2021-02-26 DIAGNOSIS — E559 Vitamin D deficiency, unspecified: Secondary | ICD-10-CM

## 2021-02-26 DIAGNOSIS — M778 Other enthesopathies, not elsewhere classified: Secondary | ICD-10-CM

## 2021-02-26 DIAGNOSIS — Z13220 Encounter for screening for lipoid disorders: Secondary | ICD-10-CM

## 2021-02-26 DIAGNOSIS — M4120 Other idiopathic scoliosis, site unspecified: Secondary | ICD-10-CM

## 2021-02-26 DIAGNOSIS — Z125 Encounter for screening for malignant neoplasm of prostate: Secondary | ICD-10-CM

## 2021-02-26 DIAGNOSIS — M25521 Pain in right elbow: Secondary | ICD-10-CM

## 2021-02-26 LAB — URINALYSIS
Bilirubin, UA: NEGATIVE
Glucose, UA: NEGATIVE
Ketones, UA: NEGATIVE
Leukocytes,UA: NEGATIVE
Nitrite, UA: NEGATIVE
Protein,UA: NEGATIVE
RBC, UA: NEGATIVE
Specific Gravity, UA: >1.03 — ABNORMAL HIGH (ref 1.005–1.030)
Urobilinogen, Ur: 0.2 mg/dL (ref 0.2–1.0)
pH, UA: 5.5 (ref 5.0–7.5)

## 2021-02-26 MED ORDER — CELECOXIB 400 MG PO CAPS: 400.0000 mg | ORAL_CAPSULE | Freq: Every day | ORAL | 1 refills | Status: DC

## 2021-02-26 NOTE — Patient Instructions (Signed)
Back Exercises The following exercises strengthen the muscles that help to support the trunk and back. They also help to keep the lower back flexible. Doing these exercises can help to prevent back pain or lessen existing pain. If you have back pain or discomfort, try doing these exercises 2-3 times each day or as told by your health care provider. As your pain improves, do them once each day, but increase the number of times that you repeat the steps for each exercise (do more repetitions). To prevent the recurrence of back pain, continue to do these exercises once each day or as told by your health care provider. Do exercises exactly as told by your health care provider and adjust them as directed. It is normal to feel mild stretching, pulling, tightness, or discomfort as you do these exercises, but you should stop right away if youfeel sudden pain or your pain gets worse. Exercises Single knee to chest Repeat these steps 3-5 times for each leg: Lie on your back on a firm bed or the floor with your legs extended. Bring one knee to your chest. Your other leg should stay extended and in contact with the floor. Hold your knee in place by grabbing your knee or thigh with both hands and hold. Pull on your knee until you feel a gentle stretch in your lower back or buttocks. Hold the stretch for 10-30 seconds. Slowly release and straighten your leg. Pelvic tilt Repeat these steps 5-10 times: Lie on your back on a firm bed or the floor with your legs extended. Bend your knees so they are pointing toward the ceiling and your feet are flat on the floor. Tighten your lower abdominal muscles to press your lower back against the floor. This motion will tilt your pelvis so your tailbone points up toward the ceiling instead of pointing to your feet or the floor. With gentle tension and even breathing, hold this position for 5-10 seconds. Cat-cow Repeat these steps until your lower back becomes more  flexible: Get into a hands-and-knees position on a firm surface. Keep your hands under your shoulders, and keep your knees under your hips. You may place padding under your knees for comfort. Let your head hang down toward your chest. Contract your abdominal muscles and point your tailbone toward the floor so your lower back becomes rounded like the back of a cat. Hold this position for 5 seconds. Slowly lift your head, let your abdominal muscles relax and point your tailbone up toward the ceiling so your back forms a sagging arch like the back of a cow. Hold this position for 5 seconds.  Press-ups Repeat these steps 5-10 times: Lie on your abdomen (face-down) on the floor. Place your palms near your head, about shoulder-width apart. Keeping your back as relaxed as possible and keeping your hips on the floor, slowly straighten your arms to raise the top half of your body and lift your shoulders. Do not use your back muscles to raise your upper torso. You may adjust the placement of your hands to make yourself more comfortable. Hold this position for 5 seconds while you keep your back relaxed. Slowly return to lying flat on the floor.  Bridges Repeat these steps 10 times: Lie on your back on a firm surface. Bend your knees so they are pointing toward the ceiling and your feet are flat on the floor. Your arms should be flat at your sides, next to your body. Tighten your buttocks muscles and lift your   buttocks off the floor until your waist is at almost the same height as your knees. You should feel the muscles working in your buttocks and the back of your thighs. If you do not feel these muscles, slide your feet 1-2 inches farther away from your buttocks. Hold this position for 3-5 seconds. Slowly lower your hips to the starting position, and allow your buttocks muscles to relax completely. If this exercise is too easy, try doing it with your arms crossed over yourchest. Abdominal  crunches Repeat these steps 5-10 times: Lie on your back on a firm bed or the floor with your legs extended. Bend your knees so they are pointing toward the ceiling and your feet are flat on the floor. Cross your arms over your chest. Tip your chin slightly toward your chest without bending your neck. Tighten your abdominal muscles and slowly raise your trunk (torso) high enough to lift your shoulder blades a tiny bit off the floor. Avoid raising your torso higher than that because it can put too much stress on your low back and does not help to strengthen your abdominal muscles. Slowly return to your starting position. Back lifts Repeat these steps 5-10 times: Lie on your abdomen (face-down) with your arms at your sides, and rest your forehead on the floor. Tighten the muscles in your legs and your buttocks. Slowly lift your chest off the floor while you keep your hips pressed to the floor. Keep the back of your head in line with the curve in your back. Your eyes should be looking at the floor. Hold this position for 3-5 seconds. Slowly return to your starting position. Contact a health care provider if: Your back pain or discomfort gets much worse when you do an exercise. Your worsening back pain or discomfort does not lessen within 2 hours after you exercise. If you have any of these problems, stop doing these exercises right away. Do not do them again unless your health care provider says that you can. Get help right away if: You develop sudden, severe back pain. If this happens, stop doing the exercises right away. Do not do them again unless your health care provider says that you can. This information is not intended to replace advice given to you by your health care provider. Make sure you discuss any questions you have with your healthcare provider. Document Revised: 11/05/2018 Document Reviewed: 04/02/2018 Elsevier Patient Education  2022 [employer]  

## 2021-02-26 NOTE — Progress Notes (Signed)
Subjective:  Patient ID: Christopher Maxwell, male    DOB: [birth year]  Age: 50 y.o. MRN: [id number]  CC: Annual Exam   HPI Christopher Maxwell presents for Annual physical.   Prednisone didn't help much. Pain is annoying. Causes pain with strumming guitar.   Depression screen [facility] 2/9 02/26/2021 02/07/2021 09/18/2020  Decreased Interest 0 0 0  Down, Depressed, Hopeless 0 0 0  PHQ - 2 Score 0 0 0  Altered sleeping - - -  Tired, decreased energy - - -  Change in appetite - - -  Feeling bad or failure about yourself  - - -  Trouble concentrating - - -  Moving slowly or fidgety/restless - - -  Suicidal thoughts - - -  PHQ-9 Score - - -    History Christopher Maxwell has a past medical history of Essential hypertension (07/28/2019) and Plantar fasciitis, bilateral.   He has a past surgical history that includes Wisdom tooth extraction and Vasectomy.   His family history includes Cancer in his mother; Diabetes in his father.He reports that he quit smoking about 9 years ago. His smoking use included cigarettes. He has never used smokeless tobacco. He reports current alcohol use of about 2.0 standard drinks per week. He reports that he does not use drugs.    ROS Review of Systems  Constitutional:  Negative for activity change, fatigue and unexpected weight change.  HENT:  Negative for congestion, ear pain, hearing loss, postnasal drip and trouble swallowing.   Eyes:  Negative for pain and visual disturbance.  Respiratory:  Negative for cough, chest tightness and shortness of breath.   Cardiovascular:  Negative for chest pain, palpitations and leg swelling.  Gastrointestinal:  Negative for abdominal distention, abdominal pain, blood in stool, constipation, diarrhea, nausea and vomiting.  Endocrine: Negative for cold intolerance, heat intolerance and polydipsia.  Genitourinary:  Negative for difficulty urinating, dysuria, flank pain, frequency, penile pain, testicular pain and urgency.   Musculoskeletal:  Negative for arthralgias and joint swelling.  Skin:  Negative for color change, rash and wound.  Neurological:  Negative for dizziness, syncope, speech difficulty, weakness, light-headedness, numbness and headaches.  Hematological:  Does not bruise/bleed easily.  Psychiatric/Behavioral:  Negative for confusion, decreased concentration, dysphoric mood and sleep disturbance. The patient is not nervous/anxious.    Objective:  BP 112/76   Pulse 84   Temp 98 F (36.7 C)   Ht _0  (1.854 m)   Wt 164 lb (74.4 kg)   SpO2 98%   BMI 21.64 kg/m   BP Readings from Last 3 Encounters:  02/26/21 112/76  02/07/21 113/69  09/18/20 120/76    Wt Readings from Last 3 Encounters:  02/26/21 164 lb (74.4 kg)  02/07/21 164 lb 12.8 oz (74.8 kg)  09/18/20 167 lb 6.4 oz (75.9 kg)     Physical Exam Constitutional:      Appearance: He is well-developed.  HENT:     Head: Normocephalic and atraumatic.  Eyes:     Pupils: Pupils are equal, round, and reactive to light.  Neck:     Thyroid: No thyromegaly.     Trachea: No tracheal deviation.  Cardiovascular:     Rate and Rhythm: Normal rate and regular rhythm.     Heart sounds: Normal heart sounds. No murmur heard.   No friction rub. No gallop.  Pulmonary:     Breath sounds: Normal breath sounds. No wheezing or rales.  Abdominal:     General: Bowel sounds are normal. There  is no distension.     Palpations: Abdomen is soft. There is no mass.     Tenderness: no abdominal tenderness     Hernia: There is no hernia in the left inguinal area.  Genitourinary:    Penis: Normal.      Testes: Normal.  Musculoskeletal:        General: Tenderness and deformity (Marked scoliosis) present. Injury: right elbow extensor tendon origin.Normal range of motion.     Cervical back: Normal range of motion.  Lymphadenopathy:     Cervical: No cervical adenopathy.  Skin:    General: Skin is warm and dry.  Neurological:     Mental Status: He is  alert and oriented to person, place, and time.      Assessment & Plan:   Christopher Maxwell was seen today for annual exam.  Diagnoses and all orders for this visit:  Lipid screening -     Lipid panel  Essential hypertension -     CBC with Differential/Platelet -     CMP14+EGFR -     Urinalysis  Vitamin D deficiency -     VITAMIN D 25 Hydroxy (Vit-D Deficiency, Fractures)  Screening for prostate cancer -     PSA, total and free  Well adult exam -     Urinalysis  Other idiopathic scoliosis, unspecified spinal region  Extensor carpi ulnaris tendinitis -     DG Elbow 2 Views Right; Future  Other orders -     celecoxib (CELEBREX) 400 MG capsule; Take 1 capsule (400 mg total) by mouth daily. With food      I have discontinued Christopher Maxwell's mometasone, pantoprazole, diclofenac Sodium, meloxicam, metaxalone, and predniSONE. I am also having him start on celecoxib. Additionally, I am having him maintain his cholecalciferol.  Allergies as of 02/26/2021       Reactions   Azithromycin Other (See Comments)   Hypothermia, diaphoresis, weakness   Penicillin G Shortness Of Breath   Penicillins         Medication List        Accurate as of February 26, 2021  9:47 AM. If you have any questions, ask your nurse or doctor.          STOP taking these medications    diclofenac Sodium 1 % Gel Commonly known as: VOLTAREN Stopped by: Claretta Fraise, MD   meloxicam 15 MG tablet Commonly known as: MOBIC Stopped by: Claretta Fraise, MD   metaxalone 800 MG tablet Commonly known as: SKELAXIN Stopped by: Claretta Fraise, MD   mometasone 0.1 % cream Commonly known as: Elocon Stopped by: Claretta Fraise, MD   pantoprazole 40 MG tablet Commonly known as: PROTONIX Stopped by: Claretta Fraise, MD   predniSONE 10 MG tablet Commonly known as: DELTASONE Stopped by: Claretta Fraise, MD       TAKE these medications    celecoxib 400 MG capsule Commonly known as: CeleBREX Take 1  capsule (400 mg total) by mouth daily. With food Started by: Claretta Fraise, MD   cholecalciferol 1000 units tablet Commonly known as: VITAMIN D Take 2,000 Units by mouth daily.         Follow-up: Return in about 1 year (around 02/26/2022) for [facility] physical.  Claretta Fraise, M.D.

## 2021-02-27 LAB — CBC WITH DIFFERENTIAL/PLATELET
Basophils Absolute: 0.1 10*3/uL (ref 0.0–0.2)
Basos: 1 %
EOS (ABSOLUTE): 0.1 10*3/uL (ref 0.0–0.4)
Eos: 3 %
Hematocrit: 46 % (ref 37.5–51.0)
Hemoglobin: 15.4 g/dL (ref 13.0–17.7)
Immature Grans (Abs): 0 10*3/uL (ref 0.0–0.1)
Immature Granulocytes: 0 %
Lymphocytes Absolute: 1.7 10*3/uL (ref 0.7–3.1)
Lymphs: 30 %
MCH: 28.4 pg (ref 26.6–33.0)
MCHC: 33.5 g/dL (ref 31.5–35.7)
MCV: 85 fL (ref 79–97)
Monocytes Absolute: 0.4 10*3/uL (ref 0.1–0.9)
Monocytes: 7 %
Neutrophils Absolute: 3.2 10*3/uL (ref 1.4–7.0)
Neutrophils: 59 %
Platelets: 251 10*3/uL (ref 150–450)
RBC: 5.43 x10E6/uL (ref 4.14–5.80)
RDW: 13 % (ref 11.6–15.4)
WBC: 5.4 10*3/uL (ref 3.4–10.8)

## 2021-02-27 LAB — PSA, TOTAL AND FREE
PSA, Free Pct: 57.5 %
PSA, Free: 0.46 ng/mL
Prostate Specific Ag, Serum: 0.8 ng/mL (ref 0.0–4.0)

## 2021-02-27 LAB — CMP14+EGFR
ALT: 28 IU/L (ref 0–44)
AST: 18 IU/L (ref 0–40)
Albumin/Globulin Ratio: 1.8 (ref 1.2–2.2)
Albumin: 4.5 g/dL (ref 4.0–5.0)
Alkaline Phosphatase: 60 IU/L (ref 44–121)
BUN/Creatinine Ratio: 18 (ref 9–20)
BUN: 17 mg/dL (ref 6–24)
Bilirubin Total: 0.6 mg/dL (ref 0.0–1.2)
CO2: 24 mmol/L (ref 20–29)
Calcium: 9.5 mg/dL (ref 8.7–10.2)
Chloride: 101 mmol/L (ref 96–106)
Creatinine, Ser: 0.92 mg/dL (ref 0.76–1.27)
Globulin, Total: 2.5 g/dL (ref 1.5–4.5)
Glucose: 104 mg/dL — ABNORMAL HIGH (ref 65–99)
Potassium: 4.8 mmol/L (ref 3.5–5.2)
Sodium: 140 mmol/L (ref 134–144)
Total Protein: 7 g/dL (ref 6.0–8.5)
eGFR: 103 mL/min/{1.73_m2} (ref >59)

## 2021-02-27 LAB — LIPID PANEL
Chol/HDL Ratio: 2.7 ratio (ref 0.0–5.0)
Cholesterol, Total: 221 mg/dL — ABNORMAL HIGH (ref 100–199)
HDL: 81 mg/dL (ref >39)
LDL Chol Calc (NIH): 121 mg/dL — ABNORMAL HIGH (ref 0–99)
Triglycerides: 110 mg/dL (ref 0–149)
VLDL Cholesterol Cal: 19 mg/dL (ref 5–40)

## 2021-02-27 LAB — VITAMIN D 25 HYDROXY (VIT D DEFICIENCY, FRACTURES): Vit D, 25-Hydroxy: 44.1 ng/mL (ref 30.0–100.0)

## 2021-03-12 DIAGNOSIS — M7711 Lateral epicondylitis, right elbow: Secondary | ICD-10-CM | POA: Diagnosis not present

## 2021-03-14 DIAGNOSIS — M7711 Lateral epicondylitis, right elbow: Secondary | ICD-10-CM | POA: Diagnosis not present

## 2021-03-22 DIAGNOSIS — M7711 Lateral epicondylitis, right elbow: Secondary | ICD-10-CM | POA: Diagnosis not present

## 2021-03-28 DIAGNOSIS — M7711 Lateral epicondylitis, right elbow: Secondary | ICD-10-CM | POA: Diagnosis not present

## 2021-03-28 DIAGNOSIS — Z6822 Body mass index (BMI) 22.0-22.9, adult: Secondary | ICD-10-CM | POA: Diagnosis not present

## 2021-04-12 ENCOUNTER — Other Ambulatory Visit: Payer: Self-pay

## 2021-04-12 ENCOUNTER — Ambulatory Visit: Payer: BC Managed Care – PPO | Attending: Family | Admitting: Physical Therapy

## 2021-04-12 ENCOUNTER — Encounter: Payer: Self-pay | Admitting: Physical Therapy

## 2021-04-12 DIAGNOSIS — M25522 Pain in left elbow: Secondary | ICD-10-CM | POA: Diagnosis not present

## 2021-04-12 NOTE — Therapy (Signed)
[facility] [address] [place], [place], [address] Phone: [phone]   Fax:  [phone]  Physical Therapy Evaluation  Patient Details  Name: Christopher Maxwell MRN: [id number] Date of Birth: [birth year] Referring Provider (PT): Oliver Hum FNP   Encounter Date: 04/12/2021   PT End of Session - 04/12/21 1645     Visit Number 1    Number of Visits 12    Date for PT Re-Evaluation 05/24/21    PT Start Time 0315    PT Stop Time 0408    PT Time Calculation (min) 53 min    Activity Tolerance Patient tolerated treatment well    Behavior During Therapy [facility] for tasks assessed/performed             Past Medical History:  Diagnosis Date   Essential hypertension 07/28/2019   Plantar fasciitis, bilateral     Past Surgical History:  Procedure Laterality Date   VASECTOMY     WISDOM TOOTH EXTRACTION      There were no vitals filed for this visit.    Subjective Assessment - 04/12/21 1647     Subjective COVID-19 screen performed prior to patient entering clinic.  The patient presents to the clinic today with c/o right elbow pain that became quite severe in June of this year after doing a lot of work on a house.  He is wearing a brace today and this helps decrease his pain.  Increased right UE use and being without his brace increases his pain.   He tried some Chiropratic care but it made him worse.    Pertinent History Has played guitar for many years, scoliosis, rib fracture late last year and laceration to his right medial forearm.    Diagnostic tests X-ray.    Patient Stated Goals Use right UE without pain.    Currently in Pain? Yes    Pain Score 6     Pain Location Elbow    Pain Orientation Right    Pain Descriptors / Indicators Sharp;Sore;Shooting    Pain Type Acute pain    Pain Onset More than a month ago    Pain Frequency Constant    Aggravating Factors  See above.    Pain Relieving Factors See above.                 [facility] PT Assessment - 04/12/21 0001       Assessment   Medical Diagnosis Right lateral epicondylitis.    Referring Provider (PT) Oliver Hum FNP    Onset Date/Surgical Date --   June 2022.     Precautions   Precautions None      Restrictions   Weight Bearing Restrictions No      Balance Screen   Has the patient fallen in the past 6 months No    Has the patient had a decrease in activity level because of a fear of falling?  No    Is the patient reluctant to leave their home because of a fear of falling?  No      Home [employer] information [employer] manager residence      Prior Function   Level of Independence Independent      ROM / Strength   AROM / PROM / Strength AROM;Strength      AROM   Overall AROM Comments Full right elbow, forearm and wrist range of motion.      Strength   Overall Strength Comments Very painful for patient to  extend his right wrist.      Palpation   Palpation comment Tender to palpation at right lateral epicondly and over proximal 1/3 of common extensor mechanism.      Special Tests   Other special tests painful with right tennis elbow test.                        Objective measurements completed on examination: See above findings.       [facility] Adult PT Treatment/Exercise - 04/12/21 0001       Modalities   Modalities Electrical Stimulation;Ultrasound      Electrical Stimulation   Electrical Stimulation Location Right lateral epicondylar area.    Electrical Stimulation Action Low-level Pre-mod.    Electrical Stimulation Parameters 80-150 Hz x 15 minutes.    Electrical Stimulation Goals Pain      Ultrasound   Ultrasound Location Right lateral epicondylar area.    Ultrasound Parameters Combo e'stim/US at 1.50 W/CM2 x 7 minutes at 3.3 mHz on 50%.                     PT Education - 04/12/21 1650     Education Details Short duration ice massage.    Person(s) Educated Patient    Methods  Explanation;Demonstration    Comprehension Verbalized understanding;Returned demonstration                 PT Long Term Goals - 04/12/21 1724       PT LONG TERM GOAL #1   Title Independent with a HEP.    Time 6    Period Weeks    Status New      PT LONG TERM GOAL #2   Title Perfornm ADL's with right elbow pain not > 2/10.    Time 6    Period Weeks    Status New      PT LONG TERM GOAL #3   Title Play guitar with right elbow pain not > 2/10.    Time 6    Period Weeks    Status New      PT LONG TERM GOAL #4   Title Carry 8# 200 feet with right elbow pain not > 2/10.    Time 6    Period Weeks    Status New                    Plan - 04/12/21 1714     Clinical Impression Statement The patient presents to [facility] with c/o right elbow pain that has been ongoing since June.  He is tender to palpation over his righ lateral epicondyle and proximal 1/3 of the common extensor tendon/mechanism.  Right wrist extension is painful and he demonstrates a positive right Tennis elbow test.  He wear a brace which does help.  He has to limit use of his right UE due to pain.  His sleep is alos disturbed due to pain.  Patient will benefit from skilled physical therapy intervention to address pain and deficits.    Personal Factors and Comorbidities Comorbidity 1;Comorbidity 2;Other    Comorbidities Has played guitar for many years, scoliosis, rib fracture late last year and laceration to his right medial forearm.    Examination-Activity Limitations Other;Carry    Examination-Participation Restrictions Other    Stability/Clinical Decision Making Evolving/Moderate complexity    Clinical Decision Making Low    Rehab Potential Excellent    PT Frequency 2x / week  PT Duration 6 weeks    PT Treatment/Interventions ADLs/Self Care Home Management;Cryotherapy;Electrical Stimulation;Ultrasound;Moist Heat;Iontophoresis 4mg /ml Dexamethasone;Therapeutic activities;Therapeutic exercise;Manual  techniques;Patient/family education;Passive range of motion;Dry needling;Joint Manipulations    PT Next Visit Plan Pulsed combo e'stim/US, gentle IASTM, Iontophoresis per signed consent, yellow putty if not painful.  Non-painful right wrist extension.    Consulted and Agree with Plan of Care Patient             Patient will benefit from skilled therapeutic intervention in order to improve the following deficits and impairments:  Pain, Decreased activity tolerance, Decreased strength, Increased muscle spasms  Visit Diagnosis: Pain in left elbow - Plan: PT plan of care cert/re-cert     Problem List Patient Active Problem List   Diagnosis Date Noted   Umbilical hernia without obstruction and without gangrene 02/11/2017   Tinea versicolor 02/11/2017   Idiopathic scoliosis 01/24/2016    Srishti Strnad, 03/26/2016, PT 04/12/2021, 5:27 PM  [facility] [address] [place], [place], [place] Phone: [phone]   Fax:  [phone]  Name: Christopher Maxwell MRN: [name] Date of Birth: [birth year]

## 2021-04-17 ENCOUNTER — Other Ambulatory Visit: Payer: Self-pay

## 2021-04-17 ENCOUNTER — Encounter: Payer: Self-pay | Admitting: Physical Therapy

## 2021-04-17 ENCOUNTER — Ambulatory Visit: Payer: BC Managed Care – PPO | Attending: Family | Admitting: Physical Therapy

## 2021-04-17 DIAGNOSIS — M25522 Pain in left elbow: Secondary | ICD-10-CM | POA: Insufficient documentation

## 2021-04-17 NOTE — Therapy (Signed)
[facility] [address] [place], [place], [address] Phone: [phone]   Fax:  [phone]  Physical Therapy Treatment  Patient Details  Name: Christopher Maxwell MRN: [id number] Date of Birth: 01-04-1971 Referring Provider (PT): Oliver Hum FNP   Encounter Date: 04/17/2021   PT End of Session - 04/17/21 1611     Visit Number 2    Number of Visits 12    Date for PT Re-Evaluation 05/24/21    PT Start Time 1518    PT Stop Time 1558    PT Time Calculation (min) 40 min    Activity Tolerance Patient tolerated treatment well    Behavior During Therapy [facility] for tasks assessed/performed             Past Medical History:  Diagnosis Date   Essential hypertension 07/28/2019   Plantar fasciitis, bilateral     Past Surgical History:  Procedure Laterality Date   VASECTOMY     WISDOM TOOTH EXTRACTION      There were no vitals filed for this visit.   Subjective Assessment - 04/17/21 1512     Subjective COVID-19 screen performed prior to patient entering clinic. Reports a constant pain with elbow flexion and extension. Pain with picking up items and waking him in the night.    Pertinent History Has played guitar for many years, scoliosis, rib fracture late last year and laceration to his right medial forearm.    Diagnostic tests X-ray.    Patient Stated Goals Use right UE without pain.    Currently in Pain? Yes    Pain Score 5     Pain Location Elbow    Pain Orientation Right    Pain Descriptors / Indicators Discomfort    Pain Type Acute pain    Pain Onset More than a month ago    Pain Frequency Constant                [facility] PT Assessment - 04/17/21 0001       Assessment   Medical Diagnosis Right lateral epicondylitis.    Referring Provider (PT) Oliver Hum FNP    Next MD Visit 05/02/2021      Precautions   Precautions None      Restrictions   Weight Bearing Restrictions No                            [facility] Adult PT Treatment/Exercise - 04/17/21 0001       Modalities   Modalities Electrical Stimulation;Moist Heat;Iontophoresis;Ultrasound      Moist Heat Therapy   Number Minutes Moist Heat 10 Minutes    Moist Heat Location Elbow      Electrical Stimulation   Electrical Stimulation Location R lateral epicondyle    Electrical Stimulation Action Pre-Mod    Electrical Stimulation Parameters 80-150 hz x10 min    Electrical Stimulation Goals Pain      Ultrasound   Ultrasound Location R lateral epicondyle    Ultrasound Parameters Combo 1.2 w/cm2, 100%, 1 mhz x10 min    Ultrasound Goals Pain      Iontophoresis   Type of Iontophoresis Dexamethasone    Location R lateral epicondyle    Dose 1.0 ml    Time 8      Manual Therapy   Manual Therapy Myofascial release    Myofascial Release IASTW to R proximal wrist extensors to reduce tone and pain  PT Long Term Goals - 04/12/21 1724       PT LONG TERM GOAL #1   Title Independent with a HEP.    Time 6    Period Weeks    Status New      PT LONG TERM GOAL #2   Title Perfornm ADL's with right elbow pain not > 2/10.    Time 6    Period Weeks    Status New      PT LONG TERM GOAL #3   Title Play guitar with right elbow pain not > 2/10.    Time 6    Period Weeks    Status New      PT LONG TERM GOAL #4   Title Carry 8# 200 feet with right elbow pain not > 2/10.    Time 6    Period Weeks    Status New                   Plan - 04/17/21 1612     Clinical Impression Statement Patient presented in clinic with elbow brace donned. Patient continues to limit RUE use due to elbow pain. Patient very tender and sensitive over R proximal wrist extensors. Fairly good redness response to IASTW over R proximal wrist extensors. Patient does type at work and tries to maintain or not use R hand for typing. Iontophoresis added today over R lateral epicondyle region  to patient's most painful point. Patient educated regarding medications and symptoms to monitor as well as advised to remove in four hours.    Personal Factors and Comorbidities Comorbidity 1;Comorbidity 2;Other    Comorbidities Has played guitar for many years, scoliosis, rib fracture late last year and laceration to his right medial forearm.    Examination-Activity Limitations Other;Carry    Examination-Participation Restrictions Other    Stability/Clinical Decision Making Evolving/Moderate complexity    Rehab Potential Excellent    PT Frequency 2x / week    PT Duration 6 weeks    PT Treatment/Interventions ADLs/Self Care Home Management;Cryotherapy;Electrical Stimulation;Ultrasound;Moist Heat;Iontophoresis 4mg /ml Dexamethasone;Therapeutic activities;Therapeutic exercise;Manual techniques;Patient/family education;Passive range of motion;Dry needling;Joint Manipulations    PT Next Visit Plan Pulsed combo e'stim/US, gentle IASTM, Iontophoresis per signed consent, yellow putty if not painful.  Non-painful right wrist extension.    Consulted and Agree with Plan of Care Patient             Patient will benefit from skilled therapeutic intervention in order to improve the following deficits and impairments:  Pain, Decreased activity tolerance, Decreased strength, Increased muscle spasms  Visit Diagnosis: Pain in left elbow     Problem List Patient Active Problem List   Diagnosis Date Noted   Umbilical hernia without obstruction and without gangrene 02/11/2017   Tinea versicolor 02/11/2017   Idiopathic scoliosis 01/24/2016    03/26/2016, PTA 04/17/2021, 4:15 PM  [facility] [address] [place], [place], [place] Phone: [phone]   Fax:  [phone]  Name: [name] MRN: Christopher Maxwell Date of Birth: 31-Dec-1970

## 2021-04-23 DIAGNOSIS — Z23 Encounter for immunization: Secondary | ICD-10-CM | POA: Diagnosis not present

## 2021-04-24 ENCOUNTER — Ambulatory Visit: Payer: BC Managed Care – PPO | Admitting: Physical Therapy

## 2021-04-24 ENCOUNTER — Encounter: Payer: Self-pay | Admitting: Physical Therapy

## 2021-04-24 ENCOUNTER — Other Ambulatory Visit: Payer: Self-pay

## 2021-04-24 DIAGNOSIS — M25522 Pain in left elbow: Secondary | ICD-10-CM | POA: Diagnosis not present

## 2021-04-24 NOTE — Therapy (Signed)
[facility] [address] [place], [place], [address] Phone: [phone]   Fax:  [phone]  Physical Therapy Treatment  Patient Details  Name: Christopher Maxwell MRN: [id number] Date of Birth: Apr 29, 1971 Referring Provider (PT): Oliver Hum FNP   Encounter Date: 04/24/2021   PT End of Session - 04/24/21 1546     Visit Number 3    Number of Visits 12    Date for PT Re-Evaluation 05/24/21    PT Start Time 1518    PT Stop Time 1556    PT Time Calculation (min) 38 min    Activity Tolerance Patient tolerated treatment well    Behavior During Therapy [facility] for tasks assessed/performed             Past Medical History:  Diagnosis Date   Essential hypertension 07/28/2019   Plantar fasciitis, bilateral     Past Surgical History:  Procedure Laterality Date   VASECTOMY     WISDOM TOOTH EXTRACTION      There were no vitals filed for this visit.   Subjective Assessment - 04/24/21 1517     Subjective COVID-19 screen performed prior to patient entering clinic. No relief from ionto patch. Has started diclofenac cream given by NP from work.    Pertinent History Has played guitar for many years, scoliosis, rib fracture late last year and laceration to his right medial forearm.    Diagnostic tests X-ray.    Patient Stated Goals Use right UE without pain.    Currently in Pain? Yes    Pain Score 4     Pain Location Elbow    Pain Orientation Right    Pain Descriptors / Indicators Discomfort    Pain Type Acute pain    Pain Onset More than a month ago    Pain Frequency Intermittent                [facility] PT Assessment - 04/24/21 0001       Assessment   Medical Diagnosis Right lateral epicondylitis.    Referring Provider (PT) Oliver Hum FNP    Next MD Visit 05/02/2021      Precautions   Precautions None                           [facility] Adult PT Treatment/Exercise - 04/24/21 0001       Modalities    Modalities Electrical Stimulation;Moist Heat;Ultrasound      Moist Heat Therapy   Number Minutes Moist Heat 10 Minutes    Moist Heat Location Elbow      Electrical Stimulation   Electrical Stimulation Location R lateral epicondyle    Electrical Stimulation Action Pre-Mod    Electrical Stimulation Parameters 80-150 hz x10 min    Electrical Stimulation Goals Pain;Edema      Ultrasound   Ultrasound Location R lateral epicondyle    Ultrasound Parameters Combo 1.2 w/cm2,100%, 1 mhz x10 min    Ultrasound Goals Pain      Manual Therapy   Manual Therapy Myofascial release    Myofascial Release IASTW to R proximal wrist extensors to reduce tone and pain                          PT Long Term Goals - 04/24/21 1550       PT LONG TERM GOAL #1   Title Independent with a HEP.    Time 6  Period Weeks    Status On-going      PT LONG TERM GOAL #2   Title Perfornm ADL's with right elbow pain not > 2/10.    Time 6    Period Weeks    Status On-going      PT LONG TERM GOAL #3   Title Play guitar with right elbow pain not > 2/10.    Time 6    Period Weeks    Status On-going      PT LONG TERM GOAL #4   Title Carry 8# 200 feet with right elbow pain not > 2/10.    Time 6    Period Weeks    Status On-going                   Plan - 04/24/21 1704     Clinical Impression Statement Patient presented in clinic with reports of no relief following iontophoresis in last session. Patient was given a diclofenac topical cream by NP at work and is going to try that after today's PT session. Patient continues to be very sore to palpation of R lateral epicondyle and especially proximal wrist extensors. Normal redness response with IASTW to R wrist extensors. Normal modalities response noted following removal of the modalities.    Personal Factors and Comorbidities Comorbidity 1;Comorbidity 2;Other    Comorbidities Has played guitar for many years, scoliosis, rib fracture  late last year and laceration to his right medial forearm.    Examination-Activity Limitations Other;Carry    Examination-Participation Restrictions Other    Stability/Clinical Decision Making Evolving/Moderate complexity    Rehab Potential Excellent    PT Frequency 2x / week    PT Duration 6 weeks    PT Treatment/Interventions ADLs/Self Care Home Management;Cryotherapy;Electrical Stimulation;Ultrasound;Moist Heat;Iontophoresis 4mg /ml Dexamethasone;Therapeutic activities;Therapeutic exercise;Manual techniques;Patient/family education;Passive range of motion;Dry needling;Joint Manipulations    PT Next Visit Plan Pulsed combo e'stim/US, gentle IASTM, Iontophoresis per signed consent, yellow putty if not painful.  Non-painful right wrist extension.    Consulted and Agree with Plan of Care Patient             Patient will benefit from skilled therapeutic intervention in order to improve the following deficits and impairments:  Pain, Decreased activity tolerance, Decreased strength, Increased muscle spasms  Visit Diagnosis: Pain in left elbow     Problem List Patient Active Problem List   Diagnosis Date Noted   Umbilical hernia without obstruction and without gangrene 02/11/2017   Tinea versicolor 02/11/2017   Idiopathic scoliosis 01/24/2016    03/26/2016, PTA 04/24/2021, 5:10 PM  [facility] [address] [place], [place], [place] Phone: [phone]   Fax:  [phone]  Name: Christopher Maxwell MRN: [name] Date of Birth: [birth year]

## 2021-05-02 DIAGNOSIS — M7711 Lateral epicondylitis, right elbow: Secondary | ICD-10-CM | POA: Diagnosis not present

## 2021-07-02 DIAGNOSIS — Z6822 Body mass index (BMI) 22.0-22.9, adult: Secondary | ICD-10-CM | POA: Diagnosis not present

## 2021-07-02 DIAGNOSIS — M7711 Lateral epicondylitis, right elbow: Secondary | ICD-10-CM | POA: Diagnosis not present

## 2021-08-24 ENCOUNTER — Ambulatory Visit: Payer: BC Managed Care – PPO | Admitting: Nurse Practitioner

## 2021-08-24 ENCOUNTER — Encounter: Payer: Self-pay | Admitting: Nurse Practitioner

## 2021-08-24 VITALS — BP 118/77 | Temp 98.1°F | Ht 73.0 in | Wt 168.0 lb

## 2021-08-24 DIAGNOSIS — R051 Acute cough: Secondary | ICD-10-CM | POA: Diagnosis not present

## 2021-08-24 DIAGNOSIS — R509 Fever, unspecified: Secondary | ICD-10-CM | POA: Diagnosis not present

## 2021-08-24 DIAGNOSIS — R110 Nausea: Secondary | ICD-10-CM

## 2021-08-24 DIAGNOSIS — R6889 Other general symptoms and signs: Secondary | ICD-10-CM

## 2021-08-24 LAB — VERITOR FLU A/B WAIVED
Influenza A: NEGATIVE
Influenza B: NEGATIVE

## 2021-08-24 MED ORDER — ONDANSETRON HCL 4 MG PO TABS: 4.0000 mg | ORAL_TABLET | Freq: Three times a day (TID) | ORAL | 0 refills | Status: DC | PRN

## 2021-08-24 MED ORDER — PSEUDOEPH-BROMPHEN-DM 30-2-10 MG/5ML PO SYRP: 5.0000 mL | ORAL_SOLUTION | Freq: Four times a day (QID) | ORAL | 0 refills | Status: DC | PRN

## 2021-08-24 NOTE — Patient Instructions (Signed)
Fever, Adult   A fever is an increase in your body's temperature. It often means a temperature of 100.53F (38C) or higher. Brief mild or moderate fevers often have no long-term effects. They often do not need treatment. Moderate or high fevers may make you feel uncomfortable. Sometimes, they can be a sign of a serious illness or disease. A fever that keeps coming back or that lasts a long time may cause you to lose water in your body (get dehydrated). You can take your temperature with a thermometer to see if you have a fever. Temperature can change with: Age. Time of day. Where the thermometer is put in the body. Readings may vary when the thermometer is put: In the mouth (oral). In the butt (rectal). In the ear (tympanic). Under the arm (axillary). On the forehead (temporal). Follow these instructions at home: Medicines Take over-the-counter and prescription medicines only as told by your doctor. Follow the dosing instructions carefully. If you were prescribed an antibiotic medicine, take it as told by your doctor. Do not stop taking it even if you start to feel better. General instructions Watch for any changes in your symptoms. Tell your doctor about them. Rest as needed. Drink enough fluid to keep your pee (urine) pale yellow. Sponge yourself or bathe with room-temperature water as needed. This helps to lower your body temperature. Do not use ice water. Do not use too many blankets or wear clothes that are too heavy. If your fever was caused by an infection that spreads from person to person (is contagious), such as a cold or the flu: You should stay home from work and public places for at least 24 hours after your fever is gone. Your fever should be gone for at least 24 hours without the need to use medicines. Contact a doctor if: You throw up (vomit). You cannot eat or drink without throwing up. You have watery poop (diarrhea). It hurts when you pee. Your symptoms do not get  better with treatment. You have new symptoms. You feel very weak. Get help right away if: You are short of breath or have trouble breathing. You are dizzy or you pass out (faint). You feel mixed up (confused). You have signs of not having enough water in your body, such as: Dark pee, very little pee, or no pee. Cracked lips. Dry mouth. Sunken eyes. Sleepiness. Weakness. You have very bad pain in your belly (abdomen). You keep throwing up or having watery poop. You have a rash on your skin. Your symptoms get worse all of a sudden. Summary A fever is an increase in your body's temperature. It often means a temperature of 100.53F (38C) or higher. Watch for any changes in your symptoms. Tell your doctor about them. Take all medicines only as told by your doctor. Do not go to work or other public places if your fever was caused by an illness that can spread to other people. Get help right away if you have signs that you do not have enough water in your body. This information is not intended to replace advice given to you by your health care provider. Make sure you discuss any questions you have with your health care provider. Document Revised: 11/21/2020 Document Reviewed: 11/21/2020 Elsevier Patient Education  2022 [employer] Nausea, Adult Nausea is feeling like you may vomit. Feeling like you may vomit is usually not serious, but it may be an early sign of a more serious medical problem. Vomiting is when stomach contents  forcefully come out of your mouth. If you vomit, or if you are not able to drink enough fluids, you may not have enough water in your body (get dehydrated). If you do not have enough water in your body, you may: Feel tired. Feel thirsty. Have a dry mouth. Have cracked lips. Pee (urinate) less often. Older adults and people who have other diseases or a weak body defense system (immune system) have a higher risk of not having enough water in the body. The main  goals of treating this condition are: To relieve your nausea. To ensure your nausea occurs less often. To prevent vomiting and losing too much fluid. Follow these instructions at home: Watch your symptoms for any changes. Tell your doctor about them. Eating and drinking   Take an ORS (oral rehydration solution). This is a drink that is sold at pharmacies and stores. Drink clear fluids in small amounts as you are able. These include: Water. Ice chips. Fruit juice that has water added (diluted fruit juice). Low-calorie sports drinks. Eat bland, easy-to-digest foods in small amounts as you are able, such as: Bananas. Applesauce. Rice. Low-fat (lean) meats. Toast. Crackers. Avoid drinking fluids that have a lot of sugar or caffeine in them. This includes energy drinks, sports drinks, and soda. Avoid alcohol. Avoid spicy or fatty foods. General instructions Take over-the-counter and prescription medicines only as told by your doctor. Rest at home while you get better. Drink enough fluid to keep your pee (urine) pale yellow. Take slow and deep breaths when you feel like you may vomit. Avoid food or things that have strong smells. Wash your hands often with soap and water for at least 20 seconds. If you cannot use soap and water, use hand sanitizer. Make sure that everyone in your home washes their hands well and often. Keep all follow-up visits. Contact a doctor if: You feel worse. You feel like you may vomit and this lasts for more than 2 days. You vomit. You are not able to drink fluids without vomiting. You have new symptoms. You have a fever. You have a headache. You have muscle cramps. You have a rash. You have pain while peeing. You feel light-headed or dizzy. Get help right away if: You have pain in your chest, neck, arm, or jaw. You feel very weak or you faint. You have vomit that is bright red or looks like coffee grounds. You have bloody or black poop (stools) or  poop that looks like tar. You have a very bad headache, a stiff neck, or both. You have very bad pain, cramping, or bloating in your belly (abdomen). You have trouble breathing or you are breathing very quickly. Your heart is beating very quickly. Your skin feels cold and clammy. You feel confused. You have signs of losing too much water in your body, such as: Dark pee, very little pee, or no pee. Cracked lips. Dry mouth. Sunken eyes. Sleepiness. Weakness. These symptoms may be an emergency. Get help right away. Call 911. Do not wait to see if the symptoms will go away. Do not drive yourself to the hospital. Summary Nausea is feeling like you are about vomit. If you vomit, or if you are not able to drink enough fluids, you may not have enough water in your body (get dehydrated). Eat and drink what your doctor tells you. Take over-the-counter and prescription medicines only as told by your doctor. Contact a doctor right away if your symptoms get worse or you have new  symptoms. Keep all follow-up visits. This information is not intended to replace advice given to you by your health care provider. Make sure you discuss any questions you have with your health care provider. Document Revised: 01/05/2021 Document Reviewed: 01/05/2021 Elsevier Patient Education  2022 [employer].

## 2021-08-24 NOTE — Progress Notes (Signed)
Acute Office Visit  Subjective:    Patient ID: Christopher Maxwell, male    DOB: 03/05/1971, 51 y.o.   MRN: [id number]  Chief Complaint  Patient presents with   Cough    Congestion. Sore throat, fatigue, upset stomach, fever    Cough This is a new problem. Episode onset: in the last 2 days. The problem has been unchanged. The cough is Non-productive. Associated symptoms include chills, a fever and nasal congestion. Pertinent negatives include no chest pain. Nothing aggravates the symptoms. He has tried nothing for the symptoms.  Fever  This is a new problem. The current episode started yesterday. The problem occurs constantly. The problem has been unchanged. The maximum temperature noted was 99 to 99.9 F. Associated symptoms include congestion, coughing and nausea. Pertinent negatives include no chest pain or diarrhea. He has tried acetaminophen for the symptoms. The treatment provided mild relief.  Risk factors: no contaminated food, no contaminated water and no occupational exposure     Past Medical History:  Diagnosis Date   Essential hypertension 07/28/2019   Plantar fasciitis, bilateral     Past Surgical History:  Procedure Laterality Date   VASECTOMY     WISDOM TOOTH EXTRACTION      Family History  Problem Relation Age of Onset   Cancer Mother    Diabetes Father     Social History   Socioeconomic History   Marital status: Married    Spouse name: Not on file   Number of children: Not on file   Years of education: Not on file   Highest education level: Not on file  Occupational History   Not on file  Tobacco Use   Smoking status: Former    Types: Cigarettes    Quit date: 04/15/2011    Years since quitting: 10.3   Smokeless tobacco: Never  Vaping Use   Vaping Use: Never used  Substance and Sexual Activity   Alcohol use: Yes    Alcohol/week: 2.0 standard drinks    Types: 2 Cans of beer per week    Comment: SOCIAL   Drug use: No   Sexual activity: Yes   Other Topics Concern   Not on file  Social History Narrative   Not on file   Social Determinants of Health   Financial Resource Strain: Not on file  Food Insecurity: Not on file  Transportation Needs: Not on file  Physical Activity: Not on file  Stress: Not on file  Social Connections: Not on file  Intimate Partner Violence: Not on file    Outpatient Medications Prior to Visit  Medication Sig Dispense Refill   celecoxib (CELEBREX) 400 MG capsule Take 1 capsule (400 mg total) by mouth daily. With food (Patient not taking: Reported on 04/12/2021) 30 capsule 1   cholecalciferol (VITAMIN D) 1000 units tablet Take 2,000 Units by mouth daily.      No facility-administered medications prior to visit.    Allergies  Allergen Reactions   Azithromycin Other (See Comments)    Hypothermia, diaphoresis, weakness   Penicillin G Shortness Of Breath   Penicillins     Review of Systems  Constitutional:  Positive for chills and fever.  HENT:  Positive for congestion.   Respiratory:  Positive for cough.   Cardiovascular:  Negative for chest pain.  Gastrointestinal:  Positive for nausea. Negative for diarrhea.  All other systems reviewed and are negative.     Objective:    Physical Exam Vitals and nursing note reviewed.  Constitutional:      Appearance: Normal appearance.  HENT:     Right Ear: External ear normal.     Left Ear: External ear normal.     Nose: Congestion present.     Mouth/Throat:     Mouth: Mucous membranes are moist.  Eyes:     Conjunctiva/sclera: Conjunctivae normal.  Cardiovascular:     Rate and Rhythm: Normal rate and regular rhythm.     Pulses: Normal pulses.     Heart sounds: Normal heart sounds.  Pulmonary:     Effort: Pulmonary effort is normal.     Breath sounds: Normal breath sounds.  Abdominal:     General: Bowel sounds are normal. There is no distension.     Tenderness: There is no abdominal tenderness. There is no right CVA tenderness, left CVA  tenderness, guarding or rebound.  Neurological:     Mental Status: He is alert and oriented to person, place, and time.  Psychiatric:        Mood and Affect: Mood normal.        Behavior: Behavior normal.    BP 118/77    Temp 98.1 F (36.7 C)    Ht $R'6\' 1"'kg$  (1.854 m)    Wt 168 lb (76.2 kg)    BMI 22.16 kg/m  Wt Readings from Last 3 Encounters:  08/24/21 168 lb (76.2 kg)  02/26/21 164 lb (74.4 kg)  02/07/21 164 lb 12.8 oz (74.8 kg)    Health Maintenance Due  Topic Date Due   COLONOSCOPY (Pts 45-35yrs Insurance coverage will need to be confirmed)  Never done   COVID-19 Vaccine (3 - Booster for Moderna series) 01/29/2020   INFLUENZA VACCINE  02/12/2021    There are no preventive care reminders to display for this patient.   Lab Results  Component Value Date   TSH 3.050 05/01/2017   Lab Results  Component Value Date   WBC 5.4 02/26/2021   HGB 15.4 02/26/2021   HCT 46.0 02/26/2021   MCV 85 02/26/2021   PLT 251 02/26/2021   Lab Results  Component Value Date   NA 140 02/26/2021   K 4.8 02/26/2021   CO2 24 02/26/2021   GLUCOSE 104 (H) 02/26/2021   BUN 17 02/26/2021   CREATININE 0.92 02/26/2021   BILITOT 0.6 02/26/2021   ALKPHOS 60 02/26/2021   AST 18 02/26/2021   ALT 28 02/26/2021   PROT 7.0 02/26/2021   ALBUMIN 4.5 02/26/2021   CALCIUM 9.5 02/26/2021   EGFR 103 02/26/2021   Lab Results  Component Value Date   CHOL 221 (H) 02/26/2021   Lab Results  Component Value Date   HDL 81 02/26/2021   Lab Results  Component Value Date   LDLCALC 121 (H) 02/26/2021   Lab Results  Component Value Date   TRIG 110 02/26/2021   Lab Results  Component Value Date   CHOLHDL 2.7 02/26/2021   Lab Results  Component Value Date   HGBA1C 5.6 01/24/2016       Assessment & Plan:  Take meds as prescribed - Use a cool mist humidifier  -Use saline nose sprays frequently -Force fluids -For fever or aches or pains- take Tylenol or ibuprofen. -COVID-19 swab negative, flu  swab negative -Zofran 4 mg tablet by mouth for nausea .  Follow-up visit.  Negative.  Urinalysis (0/has not really improved since then Follow up with worsening unresolved symptoms  Problem List Items Addressed This Visit   None Visit Diagnoses  Flu-like symptoms    -  Primary   Relevant Medications   brompheniramine-pseudoephedrine-DM 30-2-10 MG/5ML syrup   Other Relevant Orders   Veritor Flu A/B Waived (Completed)   Nausea       Relevant Medications   ondansetron (ZOFRAN) 4 MG tablet   Fever, unspecified fever cause       Acute cough       Relevant Medications   brompheniramine-pseudoephedrine-DM 30-2-10 MG/5ML syrup        Meds ordered this encounter  Medications   ondansetron (ZOFRAN) 4 MG tablet    Sig: Take 1 tablet (4 mg total) by mouth every 8 (eight) hours as needed for nausea or vomiting.    Dispense:  20 tablet    Refill:  0    Order Specific Question:   Supervising Provider    Answer:   Claretta Fraise [phone]   brompheniramine-pseudoephedrine-DM 30-2-10 MG/5ML syrup    Sig: Take 5 mLs by mouth 4 (four) times daily as needed.    Dispense:  120 mL    Refill:  0    Order Specific Question:   Supervising Provider    Answer:   Claretta Fraise [[id number]]     Ivy Lynn, NP

## 2021-09-11 DIAGNOSIS — R1310 Dysphagia, unspecified: Secondary | ICD-10-CM | POA: Diagnosis not present

## 2021-09-11 DIAGNOSIS — K219 Gastro-esophageal reflux disease without esophagitis: Secondary | ICD-10-CM | POA: Diagnosis not present

## 2021-09-11 DIAGNOSIS — R109 Unspecified abdominal pain: Secondary | ICD-10-CM | POA: Diagnosis not present

## 2021-09-11 DIAGNOSIS — K5900 Constipation, unspecified: Secondary | ICD-10-CM | POA: Diagnosis not present

## 2021-09-18 ENCOUNTER — Ambulatory Visit: Payer: BC Managed Care – PPO | Admitting: Family Medicine

## 2021-09-18 ENCOUNTER — Encounter: Payer: Self-pay | Admitting: Family Medicine

## 2021-09-18 ENCOUNTER — Ambulatory Visit (INDEPENDENT_AMBULATORY_CARE_PROVIDER_SITE_OTHER): Payer: BC Managed Care – PPO

## 2021-09-18 VITALS — BP 113/78 | HR 85 | Temp 97.7°F | Ht 73.0 in | Wt 168.8 lb

## 2021-09-18 DIAGNOSIS — J219 Acute bronchiolitis, unspecified: Secondary | ICD-10-CM | POA: Diagnosis not present

## 2021-09-18 DIAGNOSIS — R052 Subacute cough: Secondary | ICD-10-CM

## 2021-09-18 DIAGNOSIS — R059 Cough, unspecified: Secondary | ICD-10-CM | POA: Diagnosis not present

## 2021-09-18 MED ORDER — LEVOFLOXACIN 500 MG PO TABS: 500.0000 mg | ORAL_TABLET | Freq: Every day | ORAL | 0 refills | Status: DC

## 2021-09-18 MED ORDER — BETAMETHASONE SOD PHOS & ACET 6 (3-3) MG/ML IJ SUSP
6.0000 mg | Freq: Once | INTRAMUSCULAR | Status: AC
  Administered 2021-09-18: 6 mg via INTRAMUSCULAR

## 2021-09-18 NOTE — Progress Notes (Signed)
? ?Subjective:  ?Patient ID: Christopher Maxwell, male    DOB: 23-Feb-1971  Age: 51 y.o. MRN: [id number] ? ?CC: Cough ? ? ?HPI ?Christopher Maxwell presents for cough for over a month. Bilateral anterior chest pain. Productive of brown sputum for the first several days. COugh worse if he is active. No fever. Intermittent dyspnea. Worse with laying down, initially. ? ?Depression screen [facility] 2/9 09/18/2021 08/24/2021 02/26/2021  ?Decreased Interest 0 0 0  ?Down, Depressed, Hopeless 0 0 0  ?PHQ - 2 Score 0 0 0  ?Altered sleeping - 0 -  ?Tired, decreased energy - 0 -  ?Change in appetite - 0 -  ?Feeling bad or failure about yourself  - 0 -  ?Trouble concentrating - 0 -  ?Moving slowly or fidgety/restless - 0 -  ?Suicidal thoughts - 0 -  ?PHQ-9 Score - 0 -  ?Difficult doing work/chores - Not difficult at all -  ? ? ?History ?Christopher Maxwell has a past medical history of Essential hypertension (07/28/2019) and Plantar fasciitis, bilateral.  ? ?He has a past surgical history that includes Wisdom tooth extraction and Vasectomy.  ? ?His family history includes Cancer in his mother; Diabetes in his father.He reports that he quit smoking about 10 years ago. His smoking use included cigarettes. He has never used smokeless tobacco. He reports current alcohol use of about 2.0 standard drinks per week. He reports that he does not use drugs. ? ? ? ?ROS ?Review of Systems  ?Constitutional:  Positive for activity change and fatigue. Negative for fever.  ?HENT:  Positive for congestion.   ?Respiratory:  Positive for cough, chest tightness, shortness of breath and wheezing.   ? ?Objective:  ?BP 113/78   Pulse 85   Temp 97.7 ?F (36.5 ?C)   Ht 6\' 1"  (1.854 m)   Wt 168 lb 12.8 oz (76.6 kg)   SpO2 98%   BMI 22.27 kg/m?  ? ?BP Readings from Last 3 Encounters:  ?09/18/21 113/78  ?08/24/21 118/77  ?02/26/21 112/76  ? ? ?Wt Readings from Last 3 Encounters:  ?09/18/21 168 lb 12.8 oz (76.6 kg)  ?08/24/21 168 lb (76.2 kg)  ?02/26/21 164 lb (74.4 kg)   ? ? ? ?Physical Exam ?Constitutional:   ?   Appearance: He is well-developed.  ?HENT:  ?   Head: Normocephalic and atraumatic.  ?   Right Ear: Tympanic membrane and external ear normal. No decreased hearing noted.  ?   Left Ear: Tympanic membrane and external ear normal. No decreased hearing noted.  ?   Nose: Mucosal edema present.  ?   Right Sinus: No frontal sinus tenderness.  ?   Left Sinus: No frontal sinus tenderness.  ?   Mouth/Throat:  ?   Pharynx: No oropharyngeal exudate or posterior oropharyngeal erythema.  ?Eyes:  ?   Pupils: Pupils are equal, round, and reactive to light.  ?Neck:  ?   Meningeal: Brudzinski's sign absent.  ?Pulmonary:  ?   Effort: No respiratory distress.  ?   Breath sounds: Decreased air movement present. Examination of the right-middle field reveals wheezing. Examination of the left-middle field reveals wheezing. Examination of the right-lower field reveals rhonchi. Examination of the left-lower field reveals rhonchi. Decreased breath sounds, wheezing and rhonchi present. No rales.  ?Chest:  ?   Chest wall: Tenderness present.  ?Abdominal:  ?   Palpations: Abdomen is soft.  ?   Tenderness: There is no abdominal tenderness.  ?Lymphadenopathy:  ?   Head:  ?  Right side of head: No preauricular adenopathy.  ?   Left side of head: No preauricular adenopathy.  ?   Cervical:  ?   Right cervical: No superficial cervical adenopathy. ?   Left cervical: No superficial cervical adenopathy.  ?Skin: ?   General: Skin is warm and dry.  ?Neurological:  ?   Mental Status: He is alert and oriented to person, place, and time.  ? ? ? ? ?Assessment & Plan:  ? ?Christopher Maxwell was seen today for cough. ? ?Diagnoses and all orders for this visit: ? ?Bronchiolitis ?-     betamethasone acetate-betamethasone sodium phosphate (CELESTONE) injection 6 mg ? ?Subacute cough ?-     DG Chest 2 View; Future ? ?Other orders ?-     levofloxacin (LEVAQUIN) 500 MG tablet; Take 1 tablet (500 mg total) by mouth daily. For 10  days ? ? ? ? ? ? ?I am having Christopher Maxwell start on levofloxacin. I am also having him maintain his cholecalciferol, celecoxib, ondansetron, and brompheniramine-pseudoephedrine-DM. We administered betamethasone acetate-betamethasone sodium phosphate. ? ?Allergies as of 09/18/2021   ? ?   Reactions  ? Azithromycin Other (See Comments)  ? Hypothermia, diaphoresis, weakness  ? Penicillin G Shortness Of Breath  ? Penicillins   ? ?  ? ?  ?Medication List  ?  ? ?  ? Accurate as of September 18, 2021  5:05 PM. If you have any questions, ask your nurse or doctor.  ?  ?  ? ?  ? ?brompheniramine-pseudoephedrine-DM 30-2-10 MG/5ML syrup ?Take 5 mLs by mouth 4 (four) times daily as needed. ?  ?celecoxib 400 MG capsule ?Commonly known as: CeleBREX ?Take 1 capsule (400 mg total) by mouth daily. With food ?  ?cholecalciferol 1000 units tablet ?Commonly known as: VITAMIN D ?Take 2,000 Units by mouth daily. ?  ?levofloxacin 500 MG tablet ?Commonly known as: LEVAQUIN ?Take 1 tablet (500 mg total) by mouth daily. For 10 days ?Started by: Mechele Claude, MD ?  ?ondansetron 4 MG tablet ?Commonly known as: Zofran ?Take 1 tablet (4 mg total) by mouth every 8 (eight) hours as needed for nausea or vomiting. ?  ? ?  ? ? ? ?Follow-up: No follow-ups on file. ? ?Mechele Claude, M.D. ?

## 2021-09-19 NOTE — Progress Notes (Signed)
Your chest x-ray looked normal. Thanks, WS.

## 2021-11-09 ENCOUNTER — Ambulatory Visit: Payer: BC Managed Care – PPO | Admitting: Nurse Practitioner

## 2021-11-09 ENCOUNTER — Encounter: Payer: Self-pay | Admitting: Nurse Practitioner

## 2021-11-09 VITALS — BP 122/78 | HR 92 | Temp 97.7°F | Ht 73.0 in | Wt 170.0 lb

## 2021-11-09 DIAGNOSIS — J029 Acute pharyngitis, unspecified: Secondary | ICD-10-CM

## 2021-11-09 MED ORDER — DOXYCYCLINE HYCLATE 100 MG PO TABS: 100.0000 mg | ORAL_TABLET | Freq: Two times a day (BID) | ORAL | 0 refills | Status: DC

## 2021-11-09 NOTE — Progress Notes (Signed)
? ? ? ? ?Acute Office Visit ? ?Subjective:  ? ?  ?Patient ID: Christopher Maxwell, male    DOB: 02-May-1971, 51 y.o.   MRN: [id number] ? ?Chief Complaint  ?Patient presents with  ? Sinus Problem  ?  X 6 days  ?COVID- Negative  ? ? ?Sinus Problem ?This is a new problem. The current episode started in the past 7 days. The problem is unchanged. There has been no fever. The pain is moderate. Associated symptoms include coughing, headaches and swollen glands. Pertinent negatives include no chills, ear pain or hoarse voice.  ?Sore Throat  ?This is a new problem. The current episode started in the past 7 days. The problem has been unchanged. Neither side of throat is experiencing more pain than the other. There has been no fever. The pain is at a severity of 6/10. The pain is moderate. Associated symptoms include coughing, headaches, swollen glands and trouble swallowing. Pertinent negatives include no ear pain or hoarse voice. He has had no exposure to strep. He has tried nothing for the symptoms.  ? ?Review of Systems  ?Constitutional:  Negative for chills and fever.  ?HENT:  Positive for trouble swallowing. Negative for ear pain and hoarse voice.   ?Eyes: Negative.   ?Respiratory:  Positive for cough.   ?Cardiovascular: Negative.   ?Skin: Negative.  Negative for rash.  ?Neurological:  Positive for headaches.  ?All other systems reviewed and are negative. ? ? ?   ?Objective:  ?  ?BP 122/78   Pulse 92   Temp 97.7 ?F (36.5 ?C)   Ht 6\' 1"  (1.854 m)   Wt 170 lb (77.1 kg)   SpO2 98%   BMI 22.43 kg/m?  ? ? ?Physical Exam ?Vitals and nursing note reviewed.  ?Constitutional:   ?   Appearance: Normal appearance.  ?HENT:  ?   Head: Normocephalic.  ?   Right Ear: External ear normal.  ?   Left Ear: External ear normal.  ?   Nose: Congestion present.  ?Eyes:  ?   Conjunctiva/sclera: Conjunctivae normal.  ?Cardiovascular:  ?   Rate and Rhythm: Normal rate and regular rhythm.  ?   Pulses: Normal pulses.  ?   Heart sounds: Normal  heart sounds.  ?Pulmonary:  ?   Effort: Pulmonary effort is normal.  ?   Breath sounds: Normal breath sounds.  ?Abdominal:  ?   General: Bowel sounds are normal.  ?Skin: ?   General: Skin is warm.  ?   Findings: No rash.  ?Neurological:  ?   Mental Status: He is alert and oriented to person, place, and time.  ?Psychiatric:     ?   Behavior: Behavior normal.  ? ? ?No results found for any visits on 11/09/21. ? ? ?   ?Assessment & Plan:  ?Take meds as prescribed ?- Use a cool mist humidifier  ?-Use saline nose sprays frequently ?-Force fluids ?-For fever or aches or pains- take Tylenol or ibuprofen. ?-at home covid-19 test negative ?-If symptoms do not improve, she may need to be COVID tested to rule this out ?Follow up with worsening unresolved symptoms  ? ?Problem List Items Addressed This Visit   ?None ?Visit Diagnoses   ? ? Pharyngitis, unspecified etiology    -  Primary  ? Relevant Medications  ? doxycycline (VIBRA-TABS) 100 MG tablet  ? ?  ? ? ?Meds ordered this encounter  ?Medications  ? doxycycline (VIBRA-TABS) 100 MG tablet  ?  Sig: Take 1  tablet (100 mg total) by mouth 2 (two) times daily.  ?  Dispense:  20 tablet  ?  Refill:  0  ?  Order Specific Question:   Supervising Provider  ?  AnswerClaretta Fraise [id number]  ? ? ?Return if symptoms worsen or fail to improve. ? ?Ivy Lynn, NP ? ? ?

## 2021-11-09 NOTE — Patient Instructions (Signed)

## 2021-12-13 DIAGNOSIS — K2950 Unspecified chronic gastritis without bleeding: Secondary | ICD-10-CM | POA: Diagnosis not present

## 2021-12-13 DIAGNOSIS — R1319 Other dysphagia: Secondary | ICD-10-CM | POA: Diagnosis not present

## 2021-12-13 DIAGNOSIS — K222 Esophageal obstruction: Secondary | ICD-10-CM | POA: Diagnosis not present

## 2021-12-13 DIAGNOSIS — K2100 Gastro-esophageal reflux disease with esophagitis, without bleeding: Secondary | ICD-10-CM | POA: Diagnosis not present

## 2021-12-13 DIAGNOSIS — Z1211 Encounter for screening for malignant neoplasm of colon: Secondary | ICD-10-CM | POA: Diagnosis not present

## 2022-02-27 ENCOUNTER — Encounter: Payer: BC Managed Care – PPO | Admitting: Family Medicine

## 2022-02-28 ENCOUNTER — Encounter: Payer: Self-pay | Admitting: Family Medicine

## 2022-02-28 ENCOUNTER — Ambulatory Visit (INDEPENDENT_AMBULATORY_CARE_PROVIDER_SITE_OTHER): Payer: BC Managed Care – PPO | Admitting: Family Medicine

## 2022-02-28 VITALS — BP 116/69 | HR 81 | Temp 98.0°F | Ht 73.0 in | Wt 172.6 lb

## 2022-02-28 DIAGNOSIS — Z125 Encounter for screening for malignant neoplasm of prostate: Secondary | ICD-10-CM | POA: Diagnosis not present

## 2022-02-28 DIAGNOSIS — G8929 Other chronic pain: Secondary | ICD-10-CM

## 2022-02-28 DIAGNOSIS — Z13220 Encounter for screening for lipoid disorders: Secondary | ICD-10-CM

## 2022-02-28 DIAGNOSIS — Z0001 Encounter for general adult medical examination with abnormal findings: Secondary | ICD-10-CM | POA: Diagnosis not present

## 2022-02-28 DIAGNOSIS — R7309 Other abnormal glucose: Secondary | ICD-10-CM | POA: Diagnosis not present

## 2022-02-28 DIAGNOSIS — Z0000 Encounter for general adult medical examination without abnormal findings: Secondary | ICD-10-CM | POA: Diagnosis not present

## 2022-02-28 DIAGNOSIS — Z1211 Encounter for screening for malignant neoplasm of colon: Secondary | ICD-10-CM

## 2022-02-28 DIAGNOSIS — E559 Vitamin D deficiency, unspecified: Secondary | ICD-10-CM | POA: Diagnosis not present

## 2022-02-28 DIAGNOSIS — M4125 Other idiopathic scoliosis, thoracolumbar region: Secondary | ICD-10-CM

## 2022-02-28 DIAGNOSIS — M25521 Pain in right elbow: Secondary | ICD-10-CM

## 2022-02-28 DIAGNOSIS — K4090 Unilateral inguinal hernia, without obstruction or gangrene, not specified as recurrent: Secondary | ICD-10-CM

## 2022-02-28 LAB — URINALYSIS
Bilirubin, UA: NEGATIVE
Glucose, UA: NEGATIVE
Ketones, UA: NEGATIVE
Leukocytes,UA: NEGATIVE
Nitrite, UA: NEGATIVE
Protein,UA: NEGATIVE
RBC, UA: NEGATIVE
Specific Gravity, UA: 1.02 (ref 1.005–1.030)
Urobilinogen, Ur: 0.2 mg/dL (ref 0.2–1.0)
pH, UA: 5.5 (ref 5.0–7.5)

## 2022-02-28 LAB — BAYER DCA HB A1C WAIVED: HB A1C (BAYER DCA - WAIVED): 5.2 % (ref 4.8–5.6)

## 2022-02-28 MED ORDER — CELECOXIB 200 MG PO CAPS: 200.0000 mg | ORAL_CAPSULE | Freq: Every day | ORAL | 5 refills | Status: DC

## 2022-02-28 NOTE — Addendum Note (Signed)
Addended by: Mechele Claude on: 02/28/2022 09:24 AM   Modules accepted: Orders

## 2022-02-28 NOTE — Progress Notes (Signed)
Subjective:  Patient ID: Christopher Maxwell, male    DOB: 09-Sep-1970  Age: 51 y.o. MRN: [id number]  CC: Annual Exam   HPI Christopher Maxwell presents for Annual Exam     02/28/2022    7:56 AM 11/09/2021    8:22 AM 09/18/2021    4:19 PM  Depression screen PHQ 2/9  Decreased Interest 0 0 0  Down, Depressed, Hopeless 0 0 0  PHQ - 2 Score 0 0 0    History Christopher Maxwell has a past medical history of Essential hypertension (07/28/2019) and Plantar fasciitis, bilateral.   He has a past surgical history that includes Wisdom tooth extraction and Vasectomy.   His family history includes Cancer in his mother; Diabetes in his father.He reports that he quit smoking about 10 years ago. His smoking use included cigarettes. He has never used smokeless tobacco. He reports current alcohol use of about 2.0 standard drinks of alcohol per week. He reports that he does not use drugs.    ROS Review of Systems  Constitutional:  Negative for activity change, fatigue and unexpected weight change.  HENT:  Negative for congestion, ear pain, hearing loss, postnasal drip and trouble swallowing.   Eyes:  Negative for pain and visual disturbance.  Respiratory:  Negative for cough, chest tightness and shortness of breath.   Cardiovascular:  Negative for chest pain, palpitations and leg swelling.  Gastrointestinal:  Negative for abdominal distention, abdominal pain, blood in stool, constipation, diarrhea, nausea and vomiting.  Endocrine: Negative for cold intolerance, heat intolerance and polydipsia.  Genitourinary:  Negative for difficulty urinating, dysuria, flank pain, frequency and urgency.  Musculoskeletal:  Positive for arthralgias (tennis elbow for a year. Recurring in spite of multiple treatments (PT, chiropractic & Ortho with injection). Negative for joint swelling.  Skin:  Negative for color change, rash and wound.  Neurological:  Negative for dizziness, syncope, speech difficulty, weakness, light-headedness,  numbness and headaches.  Hematological:  Does not bruise/bleed easily.  Psychiatric/Behavioral:  Negative for confusion, decreased concentration, dysphoric mood and sleep disturbance. The patient is not nervous/anxious.     Objective:  BP 116/69   Pulse 81   Temp 98 F (36.7 C)   Ht _0  (1.854 m)   Wt 172 lb 9.6 oz (78.3 kg)   SpO2 97%   BMI 22.77 kg/m   BP Readings from Last 3 Encounters:  02/28/22 116/69  11/09/21 122/78  09/18/21 113/78    Wt Readings from Last 3 Encounters:  02/28/22 172 lb 9.6 oz (78.3 kg)  11/09/21 170 lb (77.1 kg)  09/18/21 168 lb 12.8 oz (76.6 kg)     Physical Exam Constitutional:      Appearance: He is well-developed.  HENT:     Head: Normocephalic and atraumatic.  Eyes:     Pupils: Pupils are equal, round, and reactive to light.  Neck:     Thyroid: No thyromegaly.     Trachea: No tracheal deviation.  Cardiovascular:     Rate and Rhythm: Normal rate and regular rhythm.     Heart sounds: Normal heart sounds. No murmur heard.    No friction rub. No gallop.  Pulmonary:     Breath sounds: Normal breath sounds. No wheezing or rales.  Abdominal:     General: Bowel sounds are normal. There is no distension.     Palpations: Abdomen is soft. There is no mass.     Tenderness: There is no abdominal tenderness.     Hernia: A hernia is  present. Hernia is present in the right inguinal area. There is no hernia in the left inguinal area.  Genitourinary:    Penis: Normal.      Testes: Normal.  Musculoskeletal:        General: Tenderness: right elbow, dorsally, near ulnar canal. Normal range of motion.     Cervical back: Normal range of motion.  Lymphadenopathy:     Cervical: No cervical adenopathy.  Skin:    General: Skin is warm and dry.  Neurological:     Mental Status: He is alert and oriented to person, place, and time.       Assessment & Plan:   Gio was seen today for annual exam.  Diagnoses and all orders for this  visit:  Well adult exam -     CBC with Differential/Platelet -     CMP14+EGFR -     Lipid panel -     PSA, total and free -     VITAMIN D 25 Hydroxy (Vit-D Deficiency, Fractures) -     Urinalysis  Vitamin D deficiency -     VITAMIN D 25 Hydroxy (Vit-D Deficiency, Fractures)  Screening for prostate cancer -     PSA, total and free  Lipid screening -     Lipid panel  Screen for colon cancer -     Ambulatory referral to Gastroenterology  Other idiopathic scoliosis, thoracolumbar region  Non-recurrent unilateral inguinal hernia without obstruction or gangrene -     Ambulatory referral to General Surgery  Chronic elbow pain, right -     Nerve conduction test; Future -     Ambulatory referral to Orthopedics  Other orders -     celecoxib (CELEBREX) 200 MG capsule; Take 1 capsule (200 mg total) by mouth daily. With food       I have discontinued Chong B. Groesbeck's levofloxacin and doxycycline. I am also having him start on celecoxib. Additionally, I am having him maintain his cetirizine and Vitamin D.  Allergies as of 02/28/2022       Reactions   Azithromycin Other (See Comments)   Hypothermia, diaphoresis, weakness   Penicillin G Shortness Of Breath   Penicillins         Medication List        Accurate as of February 28, 2022  8:55 AM. If you have any questions, ask your nurse or doctor.          STOP taking these medications    doxycycline 100 MG tablet Commonly known as: VIBRA-TABS Stopped by: Claretta Fraise, MD   levofloxacin 500 MG tablet Commonly known as: LEVAQUIN Stopped by: Claretta Fraise, MD       TAKE these medications    celecoxib 200 MG capsule Commonly known as: CeleBREX Take 1 capsule (200 mg total) by mouth daily. With food Started by: Claretta Fraise, MD   cetirizine 10 MG tablet Commonly known as: ZYRTEC Take 10 mg by mouth daily.   Vitamin D 50 MCG (2000 UT) Caps Take by mouth daily.         Follow-up: Return in about  6 weeks (around 04/11/2022) for Arthritis.  Claretta Fraise, M.D.

## 2022-03-01 LAB — CMP14+EGFR
ALT: 22 IU/L (ref 0–44)
AST: 18 IU/L (ref 0–40)
Albumin/Globulin Ratio: 1.8 (ref 1.2–2.2)
Albumin: 4.6 g/dL (ref 4.1–5.1)
Alkaline Phosphatase: 65 IU/L (ref 44–121)
BUN/Creatinine Ratio: 19 (ref 9–20)
BUN: 18 mg/dL (ref 6–24)
Bilirubin Total: 0.7 mg/dL (ref 0.0–1.2)
CO2: 24 mmol/L (ref 20–29)
Calcium: 9.7 mg/dL (ref 8.7–10.2)
Chloride: 101 mmol/L (ref 96–106)
Creatinine, Ser: 0.94 mg/dL (ref 0.76–1.27)
Globulin, Total: 2.6 g/dL (ref 1.5–4.5)
Glucose: 104 mg/dL — ABNORMAL HIGH (ref 70–99)
Potassium: 4.6 mmol/L (ref 3.5–5.2)
Sodium: 140 mmol/L (ref 134–144)
Total Protein: 7.2 g/dL (ref 6.0–8.5)
eGFR: 99 mL/min/{1.73_m2} (ref >59)

## 2022-03-01 LAB — CBC WITH DIFFERENTIAL/PLATELET
Basophils Absolute: 0.1 10*3/uL (ref 0.0–0.2)
Basos: 1 %
EOS (ABSOLUTE): 0.2 10*3/uL (ref 0.0–0.4)
Eos: 4 %
Hematocrit: 45.5 % (ref 37.5–51.0)
Hemoglobin: 15.9 g/dL (ref 13.0–17.7)
Immature Grans (Abs): 0 10*3/uL (ref 0.0–0.1)
Immature Granulocytes: 0 %
Lymphocytes Absolute: 1.8 10*3/uL (ref 0.7–3.1)
Lymphs: 35 %
MCH: 29.3 pg (ref 26.6–33.0)
MCHC: 34.9 g/dL (ref 31.5–35.7)
MCV: 84 fL (ref 79–97)
Monocytes Absolute: 0.4 10*3/uL (ref 0.1–0.9)
Monocytes: 7 %
Neutrophils Absolute: 2.7 10*3/uL (ref 1.4–7.0)
Neutrophils: 53 %
Platelets: 250 10*3/uL (ref 150–450)
RBC: 5.43 x10E6/uL (ref 4.14–5.80)
RDW: 12.9 % (ref 11.6–15.4)
WBC: 5.1 10*3/uL (ref 3.4–10.8)

## 2022-03-01 LAB — LIPID PANEL
Chol/HDL Ratio: 2.8 ratio (ref 0.0–5.0)
Cholesterol, Total: 185 mg/dL (ref 100–199)
HDL: 66 mg/dL (ref >39)
LDL Chol Calc (NIH): 105 mg/dL — ABNORMAL HIGH (ref 0–99)
Triglycerides: 76 mg/dL (ref 0–149)
VLDL Cholesterol Cal: 14 mg/dL (ref 5–40)

## 2022-03-01 LAB — PSA, TOTAL AND FREE
PSA, Free Pct: 47.7 %
PSA, Free: 0.62 ng/mL
Prostate Specific Ag, Serum: 1.3 ng/mL (ref 0.0–4.0)

## 2022-03-01 LAB — VITAMIN D 25 HYDROXY (VIT D DEFICIENCY, FRACTURES): Vit D, 25-Hydroxy: 42.3 ng/mL (ref 30.0–100.0)

## 2022-03-03 NOTE — Progress Notes (Signed)
Hello Kelley,  Your lab result is normal and/or stable.Some minor variations that are not significant are commonly marked abnormal, but do not represent any medical problem for you.  Best regards, Bailey Faiella, M.D.

## 2022-03-05 DIAGNOSIS — K4090 Unilateral inguinal hernia, without obstruction or gangrene, not specified as recurrent: Secondary | ICD-10-CM | POA: Diagnosis not present

## 2022-03-05 DIAGNOSIS — K429 Umbilical hernia without obstruction or gangrene: Secondary | ICD-10-CM | POA: Diagnosis not present

## 2022-03-21 ENCOUNTER — Encounter: Payer: Self-pay | Admitting: Family Medicine

## 2022-03-22 ENCOUNTER — Telehealth: Payer: Self-pay | Admitting: Family Medicine

## 2022-03-22 NOTE — Telephone Encounter (Signed)
Addressed in telephone encounter today.

## 2022-03-22 NOTE — Telephone Encounter (Signed)
I spoke to pt and he said he had went on google and saw that kidney injury was a side effect as well as not to stop abruptly. I did speak with Harlow Mares regarding this and she advised it is ok to stop taking the Celebrex since he has only been on it for 3 weeks and in regards to the kidney injury that is related to the medication being and NSAID and chronic daily use of celebrex so that shouldn't be a factor. Pt voiced understanding. I did offer pt an appt to come in for urine sample and maybe labs if that would make him more comfortable but pt declines at this time and will monitor and call back if he changes his mind.

## 2022-03-28 ENCOUNTER — Ambulatory Visit (HOSPITAL_COMMUNITY)
Admission: RE | Admit: 2022-03-28 | Discharge: 2022-03-28 | Disposition: A | Discharge status: Home / Self Care | Payer: BC Managed Care – PPO | Source: Ambulatory Visit | Attending: Family Medicine | Admitting: Family Medicine

## 2022-03-28 ENCOUNTER — Ambulatory Visit: Payer: BC Managed Care – PPO | Admitting: Family Medicine

## 2022-03-28 ENCOUNTER — Encounter: Payer: Self-pay | Admitting: Family Medicine

## 2022-03-28 ENCOUNTER — Ambulatory Visit (INDEPENDENT_AMBULATORY_CARE_PROVIDER_SITE_OTHER): Payer: BC Managed Care – PPO

## 2022-03-28 VITALS — BP 121/77 | HR 77 | Temp 98.1°F | Ht 73.0 in | Wt 174.2 lb

## 2022-03-28 DIAGNOSIS — N200 Calculus of kidney: Secondary | ICD-10-CM

## 2022-03-28 DIAGNOSIS — I878 Other specified disorders of veins: Secondary | ICD-10-CM | POA: Diagnosis not present

## 2022-03-28 DIAGNOSIS — R300 Dysuria: Secondary | ICD-10-CM

## 2022-03-28 DIAGNOSIS — M439 Deforming dorsopathy, unspecified: Secondary | ICD-10-CM | POA: Diagnosis not present

## 2022-03-28 DIAGNOSIS — K5730 Diverticulosis of large intestine without perforation or abscess without bleeding: Secondary | ICD-10-CM | POA: Diagnosis not present

## 2022-03-28 LAB — MICROSCOPIC EXAMINATION
Bacteria, UA: NONE SEEN
Epithelial Cells (non renal): NONE SEEN /hpf (ref 0–10)
RBC, Urine: NONE SEEN /hpf (ref 0–2)
Renal Epithel, UA: NONE SEEN /hpf
WBC, UA: NONE SEEN /hpf (ref 0–5)

## 2022-03-28 LAB — URINALYSIS, COMPLETE
Bilirubin, UA: NEGATIVE
Glucose, UA: NEGATIVE
Ketones, UA: NEGATIVE
Leukocytes,UA: NEGATIVE
Nitrite, UA: NEGATIVE
Protein,UA: NEGATIVE
RBC, UA: NEGATIVE
Specific Gravity, UA: 1.015 (ref 1.005–1.030)
Urobilinogen, Ur: 0.2 mg/dL (ref 0.2–1.0)
pH, UA: 6 (ref 5.0–7.5)

## 2022-03-28 NOTE — Progress Notes (Signed)
Subjective:  Patient ID: Christopher Maxwell, male    DOB: Feb 09, 1971  Age: 51 y.o. MRN: [id number]  CC: Nephrolithiasis and Flank Pain (Right side)   HPI Christopher Maxwell presents for right flank pain in waves. Really uncomfortable. Onset 2 weeks ago. Sharp at first. Sore to touch. Then would wane. Walking would cause it to restart. Several episodes a day. Random relief. Not related to anything Christopher Maxwell could do. PAin is 5/10 and stops all activities.  Couldn't tolerate the celebrex Too much reflux. Started with first dose and continued to worsen. Had to stop it several days ago. BMs somewhat irregular. Feeling a little bloated currently.      03/28/2022    9:21 AM 02/28/2022    7:56 AM 11/09/2021    8:22 AM  Depression screen PHQ 2/9  Decreased Interest 0 0 0  Down, Depressed, Hopeless 0 0 0  PHQ - 2 Score 0 0 0    History Christopher Maxwell has a past medical history of Essential hypertension (07/28/2019) and Plantar fasciitis, bilateral.   Christopher Maxwell has a past surgical history that includes Wisdom tooth extraction and Vasectomy.   His family history includes Cancer in his mother; Diabetes in his father.Christopher Maxwell reports that Christopher Maxwell quit smoking about 10 years ago. His smoking use included cigarettes. Christopher Maxwell has never used smokeless tobacco. Christopher Maxwell reports current alcohol use of about 2.0 standard drinks of alcohol per week. Christopher Maxwell reports that Christopher Maxwell does not use drugs.    ROS Review of Systems  Constitutional:  Negative for fever.  Respiratory:  Negative for choking and shortness of breath.   Cardiovascular:  Negative for chest pain (substernal with use of celebrex).  Genitourinary:  Positive for dysuria and flank pain.  Musculoskeletal:  Negative for arthralgias.  Skin:  Negative for rash.    Objective:  BP 121/77   Pulse 77   Temp 98.1 F (36.7 C)   Ht 6\' 1"  (1.854 m)   Wt 174 lb 3.2 oz (79 kg)   SpO2 97%   BMI 22.98 kg/m   BP Readings from Last 3 Encounters:  03/28/22 121/77  02/28/22 116/69  11/09/21  122/78    Wt Readings from Last 3 Encounters:  03/28/22 174 lb 3.2 oz (79 kg)  02/28/22 172 lb 9.6 oz (78.3 kg)  11/09/21 170 lb (77.1 kg)     Physical Exam Vitals reviewed.  Constitutional:      Appearance: Christopher Maxwell is well-developed.  HENT:     Head: Normocephalic and atraumatic.     Right Ear: External ear normal.     Left Ear: External ear normal.     Mouth/Throat:     Pharynx: No oropharyngeal exudate or posterior oropharyngeal erythema.  Eyes:     Pupils: Pupils are equal, round, and reactive to light.  Cardiovascular:     Rate and Rhythm: Normal rate and regular rhythm.     Heart sounds: No murmur heard. Pulmonary:     Effort: No respiratory distress.     Breath sounds: Normal breath sounds.  Abdominal:     General: Abdomen is flat.     Palpations: Abdomen is soft.     Tenderness: There is abdominal tenderness (point tender at posterior right costal margin at posterior axillary line.).  Musculoskeletal:     Cervical back: Normal range of motion.  Neurological:     Mental Status: Christopher Maxwell is alert and oriented to person, place, and time.       Assessment & Plan:   Christopher Maxwell  was seen today for nephrolithiasis and flank pain.  Diagnoses and all orders for this visit:  Dysuria -     CT RENAL STONE STUDY; Future  Kidney stone -     Urinalysis, Complete -     DG Abd 1 View; Future -     CT RENAL STONE STUDY; Future       I am having Christopher Maxwell maintain his cetirizine, Vitamin D, and celecoxib.  Allergies as of 03/28/2022       Reactions   Azithromycin Other (See Comments)   Hypothermia, diaphoresis, weakness   Penicillin G Shortness Of Breath   Penicillins         Medication List        Accurate as of March 28, 2022 10:04 AM. If you have any questions, ask your nurse or doctor.          celecoxib 200 MG capsule Commonly known as: CeleBREX Take 1 capsule (200 mg total) by mouth daily. With food   cetirizine 10 MG tablet Commonly  known as: ZYRTEC Take 10 mg by mouth daily.   Vitamin D 50 MCG (2000 UT) Caps Take by mouth daily.         Follow-up: Return if symptoms worsen or fail to improve.  Mechele Claude, M.D.

## 2022-04-10 ENCOUNTER — Encounter: Payer: Self-pay | Admitting: Radiology

## 2022-04-18 ENCOUNTER — Ambulatory Visit: Payer: BC Managed Care – PPO | Admitting: Family Medicine

## 2022-04-22 ENCOUNTER — Ambulatory Visit: Payer: Self-pay | Admitting: General Surgery

## 2022-04-22 NOTE — Pre-Procedure Instructions (Signed)
Surgical Instructions    Your procedure is scheduled on Tuesday, April 30, 2022.  Report to [facility] Main Entrance "A" at 8:30  A.M., then check in with the Admitting office.  Call this number if you have problems the morning of surgery:  [phone]   If you have any questions prior to your surgery date call [phone]: Open Monday-Friday 8am-4pm If you experience any cold or flu symptoms such as cough, fever, chills, shortness of breath, etc. between now and your scheduled surgery, please notify us at the above number     Remember:  Do not eat after midnight the night before your surgery  You may drink clear liquids until 7:30 am the morning of your surgery.   Clear liquids allowed are: Water, Non-Citrus Juices (without pulp), Carbonated Beverages, Clear Tea, Black Coffee ONLY (NO MILK, CREAM OR POWDERED CREAMER of any kind), and Gatorade    Take these medicines the morning of surgery with A SIP OF WATER:   cetirizine (ZYRTEC)  fluticasone (FLONASE)  naphazoline-pheniramine (VISINE)  As of today, STOP taking any Aspirin (unless otherwise instructed by your surgeon) Aleve, Naproxen, Ibuprofen, Motrin, Advil, Goody's, BC's, all herbal medications, fish oil, and all vitamins.           Do not wear jewelry. Do not wear lotions, powders, cologne or deodorant. Men may shave face and neck. Do not bring valuables to the hospital. If you have artificial nails or gel coating that need to be removed by a nail salon, please have this removed prior to surgery. Artificial nails or gel coating may interfere with anesthesia's ability to adequately monitor your vital signs.  [facility] is not responsible for any belongings or valuables.    Do NOT Smoke (Tobacco/Vaping)  24 hours prior to your procedure  If you use a CPAP at night, you may bring your mask for your overnight stay.   Contacts, glasses, hearing aids, dentures or partials may not be worn into surgery, please bring cases for  these belongings   For patients admitted to the hospital, discharge time will be determined by your treatment team.   Patients discharged the day of surgery will not be allowed to drive home, and someone needs to stay with them for 24 hours.   SURGICAL WAITING ROOM VISITATION Patients having surgery or a procedure may have no more than 2 support people in the waiting area - these visitors may rotate.   Children under the age of 43 must have an adult with them who is not the patient. If the patient needs to stay at the hospital during part of their recovery, the visitor guidelines for inpatient rooms apply. Pre-op nurse will coordinate an appropriate time for 1 support person to accompany patient in pre-op.  This support person may not rotate.   Please refer to the [facility] website for the visitor guidelines for Inpatients (after your surgery is over and you are in a regular room).    Special instructions:    Oral Hygiene is also important to reduce your risk of infection.  Remember - BRUSH YOUR TEETH THE MORNING OF SURGERY WITH YOUR REGULAR TOOTHPASTE   [place] [facility]- Preparing For Surgery  Before surgery, you can play an important role. Because skin is not sterile, your skin needs to be as free of germs as possible. You can reduce the number of germs on your skin by washing with CHG (chlorahexidine gluconate) Soap before surgery.  CHG is an antiseptic cleaner which kills germs and  bonds with the skin to continue killing germs even after washing.     Please do not use if you have an allergy to CHG or antibacterial soaps. If your skin becomes reddened/irritated stop using the CHG.  Do not shave (including legs and underarms) for at least 48 hours prior to first CHG shower. It is OK to shave your face.  Please follow these instructions carefully.     Shower the NIGHT BEFORE SURGERY and the MORNING OF SURGERY with CHG Soap.   If you chose to wash your hair, wash your hair first as usual  with your normal shampoo. After you shampoo, rinse your hair and body thoroughly to remove the shampoo.  Then [employer] and genitals (private parts) with your normal soap and rinse thoroughly to remove soap.  After that Use CHG Soap as you would any other liquid soap. You can apply CHG directly to the skin and wash gently with a scrungie or a clean washcloth.   Apply the CHG Soap to your body ONLY FROM THE NECK DOWN.  Do not use on open wounds or open sores. Avoid contact with your eyes, ears, mouth and genitals (private parts). Wash Face and genitals (private parts)  with your normal soap.   Wash thoroughly, paying special attention to the area where your surgery will be performed.  Thoroughly rinse your body with warm water from the neck down.  DO NOT shower/wash with your normal soap after using and rinsing off the CHG Soap.  Pat yourself dry with a CLEAN TOWEL.  Wear CLEAN PAJAMAS to bed the night before surgery  Place CLEAN SHEETS on your bed the night before your surgery  DO NOT SLEEP WITH PETS.   Day of Surgery:  Take a shower with CHG soap. Wear Clean/Comfortable clothing the morning of surgery Do not apply any deodorants/lotions.   Remember to brush your teeth WITH YOUR REGULAR TOOTHPASTE.    If you received a COVID test during your pre-op visit, it is requested that you wear a mask when out in public, stay away from anyone that may not be feeling well, and notify your surgeon if you develop symptoms. If you have been in contact with anyone that has tested positive in the last 10 days, please notify your surgeon.    Please read over the following fact sheets that you were given.

## 2022-04-23 ENCOUNTER — Encounter (HOSPITAL_COMMUNITY): Payer: Self-pay

## 2022-04-23 ENCOUNTER — Encounter (HOSPITAL_COMMUNITY)
Admission: RE | Admit: 2022-04-23 | Discharge: 2022-04-23 | Disposition: A | Discharge status: Home / Self Care | Payer: BC Managed Care – PPO | Source: Ambulatory Visit | Attending: General Surgery | Admitting: General Surgery

## 2022-04-23 ENCOUNTER — Other Ambulatory Visit: Payer: Self-pay

## 2022-04-23 VITALS — BP 120/87 | HR 80 | Temp 98.0°F | Resp 17 | Ht 69.0 in | Wt 174.2 lb

## 2022-04-23 DIAGNOSIS — I10 Essential (primary) hypertension: Secondary | ICD-10-CM | POA: Diagnosis not present

## 2022-04-23 DIAGNOSIS — Z01818 Encounter for other preprocedural examination: Secondary | ICD-10-CM | POA: Insufficient documentation

## 2022-04-23 HISTORY — DX: Scoliosis, unspecified: M41.9

## 2022-04-23 HISTORY — DX: Nonrheumatic mitral (valve) prolapse: I34.1

## 2022-04-23 HISTORY — DX: Other injury of unspecified body region, initial encounter: T14.8XXA

## 2022-04-23 LAB — BASIC METABOLIC PANEL
Anion gap: 9 (ref 5–15)
BUN: 16 mg/dL (ref 6–20)
CO2: 29 mmol/L (ref 22–32)
Calcium: 9.6 mg/dL (ref 8.9–10.3)
Chloride: 100 mmol/L (ref 98–111)
Creatinine, Ser: 0.95 mg/dL (ref 0.61–1.24)
GFR, Estimated: >60 mL/min (ref >60)
Glucose, Bld: 113 mg/dL — ABNORMAL HIGH (ref 70–99)
Potassium: 4.7 mmol/L (ref 3.5–5.1)
Sodium: 138 mmol/L (ref 135–145)

## 2022-04-23 LAB — CBC
HCT: 47.3 % (ref 39.0–52.0)
Hemoglobin: 16.2 g/dL (ref 13.0–17.0)
MCH: 29 pg (ref 26.0–34.0)
MCHC: 34.2 g/dL (ref 30.0–36.0)
MCV: 84.6 fL (ref 80.0–100.0)
Platelets: 246 10*3/uL (ref 150–400)
RBC: 5.59 MIL/uL (ref 4.22–5.81)
RDW: 12.9 % (ref 11.5–15.5)
WBC: 4.8 10*3/uL (ref 4.0–10.5)
nRBC: 0 % (ref 0.0–0.2)

## 2022-04-23 NOTE — Progress Notes (Addendum)
PCP - Cletus Gash STacks with [employer] - Denies  PPM/ICD - DEnies Device Orders -  Rep Notified -   Chest x-ray - NI EKG - 04/23/22 Stress Test - Denies ECHO - 05/22/07 Cardiac Cath - Denies  Sleep Study - Denies  DM - Denies  Blood Thinner Instructions:Denies Aspirin Instructions:DEnies  ERAS Protcol -Yes PRE-SURGERY Ensure   COVID TEST- NI   Anesthesia review: No  Patient denies shortness of breath, fever, cough and chest pain at PAT appointment   All instructions explained to the patient, with a verbal understanding of the material. Patient agrees to go over the instructions while at home for a better understanding.  The opportunity to ask questions was provided.

## 2022-04-30 ENCOUNTER — Ambulatory Visit (HOSPITAL_COMMUNITY): Payer: BC Managed Care – PPO

## 2022-04-30 ENCOUNTER — Encounter (HOSPITAL_COMMUNITY): Payer: Self-pay | Admitting: General Surgery

## 2022-04-30 ENCOUNTER — Encounter (HOSPITAL_COMMUNITY)
Admission: RE | Disposition: A | Discharge status: Home / Self Care | Payer: Self-pay | Source: Home / Self Care | Attending: General Surgery

## 2022-04-30 ENCOUNTER — Other Ambulatory Visit: Payer: Self-pay

## 2022-04-30 ENCOUNTER — Ambulatory Visit (HOSPITAL_COMMUNITY)
Admission: RE | Admit: 2022-04-30 | Discharge: 2022-04-30 | Disposition: A | Discharge status: Home / Self Care | Payer: BC Managed Care – PPO | Attending: General Surgery | Admitting: General Surgery

## 2022-04-30 DIAGNOSIS — K4090 Unilateral inguinal hernia, without obstruction or gangrene, not specified as recurrent: Secondary | ICD-10-CM | POA: Diagnosis not present

## 2022-04-30 DIAGNOSIS — K429 Umbilical hernia without obstruction or gangrene: Secondary | ICD-10-CM | POA: Insufficient documentation

## 2022-04-30 DIAGNOSIS — I10 Essential (primary) hypertension: Secondary | ICD-10-CM | POA: Diagnosis not present

## 2022-04-30 DIAGNOSIS — Z87891 Personal history of nicotine dependence: Secondary | ICD-10-CM | POA: Diagnosis not present

## 2022-04-30 DIAGNOSIS — G8918 Other acute postprocedural pain: Secondary | ICD-10-CM | POA: Diagnosis not present

## 2022-04-30 HISTORY — PX: UMBILICAL HERNIA REPAIR: SHX196

## 2022-04-30 HISTORY — PX: INGUINAL HERNIA REPAIR: SHX194

## 2022-04-30 HISTORY — PX: INSERTION OF MESH: SHX5868

## 2022-04-30 SURGERY — REPAIR, HERNIA, INGUINAL, ADULT
Anesthesia: Regional | Site: Inguinal | Laterality: Right

## 2022-04-30 MED ORDER — CHLORHEXIDINE GLUCONATE CLOTH 2 % EX PADS: 6.0000 | MEDICATED_PAD | Freq: Once | CUTANEOUS | Status: DC

## 2022-04-30 MED ORDER — LACTATED RINGERS IV SOLN: INTRAVENOUS | Status: DC

## 2022-04-30 MED ORDER — DEXAMETHASONE SODIUM PHOSPHATE 10 MG/ML IJ SOLN
INTRAMUSCULAR | Status: DC | PRN
  Administered 2022-04-30: 10 mg via INTRAVENOUS

## 2022-04-30 MED ORDER — OXYCODONE HCL 5 MG PO TABS
5.0000 mg | ORAL_TABLET | Freq: Once | ORAL | Status: AC | PRN
  Administered 2022-04-30: 5 mg via ORAL

## 2022-04-30 MED ORDER — ROCURONIUM BROMIDE 10 MG/ML (PF) SYRINGE
PREFILLED_SYRINGE | INTRAVENOUS | Status: DC | PRN
  Administered 2022-04-30: 10 mg via INTRAVENOUS
  Administered 2022-04-30: 70 mg via INTRAVENOUS
  Administered 2022-04-30: 10 mg via INTRAVENOUS

## 2022-04-30 MED ORDER — CHLORHEXIDINE GLUCONATE 0.12 % MT SOLN
15.0000 mL | Freq: Once | OROMUCOSAL | Status: AC
  Administered 2022-04-30: 15 mL via OROMUCOSAL
  Filled 2022-04-30: qty 15

## 2022-04-30 MED ORDER — ONDANSETRON HCL 4 MG/2ML IJ SOLN
INTRAMUSCULAR | Status: AC
  Filled 2022-04-30: qty 2

## 2022-04-30 MED ORDER — PHENYLEPHRINE 80 MCG/ML (10ML) SYRINGE FOR IV PUSH (FOR BLOOD PRESSURE SUPPORT)
PREFILLED_SYRINGE | INTRAVENOUS | Status: DC | PRN
  Administered 2022-04-30: 80 ug via INTRAVENOUS
  Administered 2022-04-30 (×2): 160 ug via INTRAVENOUS
  Administered 2022-04-30 (×2): 80 ug via INTRAVENOUS
  Administered 2022-04-30: 160 ug via INTRAVENOUS

## 2022-04-30 MED ORDER — OXYCODONE HCL 5 MG/5ML PO SOLN: 5.0000 mg | Freq: Once | ORAL | Status: AC | PRN

## 2022-04-30 MED ORDER — FENTANYL CITRATE (PF) 250 MCG/5ML IJ SOLN
INTRAMUSCULAR | Status: AC
  Filled 2022-04-30: qty 5

## 2022-04-30 MED ORDER — GABAPENTIN 300 MG PO CAPS
300.0000 mg | ORAL_CAPSULE | ORAL | Status: AC
  Administered 2022-04-30: 300 mg via ORAL
  Filled 2022-04-30: qty 1

## 2022-04-30 MED ORDER — CIPROFLOXACIN IN D5W 400 MG/200ML IV SOLN: 400.0000 mg | INTRAVENOUS | Status: DC

## 2022-04-30 MED ORDER — ONDANSETRON HCL 4 MG/2ML IJ SOLN
INTRAMUSCULAR | Status: DC | PRN
  Administered 2022-04-30: 4 mg via INTRAVENOUS

## 2022-04-30 MED ORDER — ROCURONIUM BROMIDE 10 MG/ML (PF) SYRINGE
PREFILLED_SYRINGE | INTRAVENOUS | Status: AC
  Filled 2022-04-30: qty 10

## 2022-04-30 MED ORDER — OXYCODONE HCL 5 MG PO TABS: 5.0000 mg | ORAL_TABLET | Freq: Four times a day (QID) | ORAL | 0 refills | Status: AC | PRN

## 2022-04-30 MED ORDER — MIDAZOLAM HCL 2 MG/2ML IJ SOLN
INTRAMUSCULAR | Status: AC
  Filled 2022-04-30: qty 2

## 2022-04-30 MED ORDER — ENSURE PRE-SURGERY PO LIQD: 296.0000 mL | Freq: Once | ORAL | Status: DC

## 2022-04-30 MED ORDER — ACETAMINOPHEN 160 MG/5ML PO SOLN: 1000.0000 mg | Freq: Once | ORAL | Status: DC | PRN

## 2022-04-30 MED ORDER — LIDOCAINE 2% (20 MG/ML) 5 ML SYRINGE
INTRAMUSCULAR | Status: AC
  Filled 2022-04-30: qty 5

## 2022-04-30 MED ORDER — 0.9 % SODIUM CHLORIDE (POUR BTL) OPTIME
TOPICAL | Status: DC | PRN
  Administered 2022-04-30: 1000 mL

## 2022-04-30 MED ORDER — CIPROFLOXACIN IN D5W 400 MG/200ML IV SOLN
400.0000 mg | INTRAVENOUS | Status: AC
  Administered 2022-04-30: 400 mg via INTRAVENOUS
  Filled 2022-04-30: qty 200

## 2022-04-30 MED ORDER — BUPIVACAINE-EPINEPHRINE (PF) 0.25% -1:200000 IJ SOLN
INTRAMUSCULAR | Status: AC
  Filled 2022-04-30: qty 30

## 2022-04-30 MED ORDER — ORAL CARE MOUTH RINSE: 15.0000 mL | Freq: Once | OROMUCOSAL | Status: AC

## 2022-04-30 MED ORDER — PROPOFOL 10 MG/ML IV BOLUS
INTRAVENOUS | Status: AC
  Filled 2022-04-30: qty 20

## 2022-04-30 MED ORDER — PROPOFOL 10 MG/ML IV BOLUS
INTRAVENOUS | Status: DC | PRN
  Administered 2022-04-30: 160 mg via INTRAVENOUS
  Administered 2022-04-30: 40 mg via INTRAVENOUS

## 2022-04-30 MED ORDER — PHENYLEPHRINE 80 MCG/ML (10ML) SYRINGE FOR IV PUSH (FOR BLOOD PRESSURE SUPPORT)
PREFILLED_SYRINGE | INTRAVENOUS | Status: AC
  Filled 2022-04-30: qty 10

## 2022-04-30 MED ORDER — DEXAMETHASONE SODIUM PHOSPHATE 10 MG/ML IJ SOLN
INTRAMUSCULAR | Status: AC
  Filled 2022-04-30: qty 1

## 2022-04-30 MED ORDER — MIDAZOLAM HCL 2 MG/2ML IJ SOLN
INTRAMUSCULAR | Status: DC | PRN
  Administered 2022-04-30: 2 mg via INTRAVENOUS

## 2022-04-30 MED ORDER — ACETAMINOPHEN 500 MG PO TABS
1000.0000 mg | ORAL_TABLET | ORAL | Status: AC
  Administered 2022-04-30: 1000 mg via ORAL
  Filled 2022-04-30: qty 2

## 2022-04-30 MED ORDER — BUPIVACAINE-EPINEPHRINE 0.5% -1:200000 IJ SOLN
INTRAMUSCULAR | Status: DC | PRN
  Administered 2022-04-30: 10 mL

## 2022-04-30 MED ORDER — ACETAMINOPHEN 10 MG/ML IV SOLN: 1000.0000 mg | Freq: Once | INTRAVENOUS | Status: DC | PRN

## 2022-04-30 MED ORDER — SUGAMMADEX SODIUM 200 MG/2ML IV SOLN
INTRAVENOUS | Status: DC | PRN
  Administered 2022-04-30: 200 mg via INTRAVENOUS

## 2022-04-30 MED ORDER — FENTANYL CITRATE (PF) 100 MCG/2ML IJ SOLN
INTRAMUSCULAR | Status: AC
  Filled 2022-04-30: qty 2

## 2022-04-30 MED ORDER — CELECOXIB 200 MG PO CAPS
400.0000 mg | ORAL_CAPSULE | ORAL | Status: AC
  Administered 2022-04-30: 400 mg via ORAL
  Filled 2022-04-30: qty 2

## 2022-04-30 MED ORDER — LIDOCAINE 2% (20 MG/ML) 5 ML SYRINGE
INTRAMUSCULAR | Status: DC | PRN
  Administered 2022-04-30: 80 mg via INTRAVENOUS

## 2022-04-30 MED ORDER — FENTANYL CITRATE (PF) 100 MCG/2ML IJ SOLN
25.0000 ug | INTRAMUSCULAR | Status: DC | PRN
  Administered 2022-04-30 (×2): 50 ug via INTRAVENOUS

## 2022-04-30 MED ORDER — ACETAMINOPHEN 500 MG PO TABS: 1000.0000 mg | ORAL_TABLET | Freq: Once | ORAL | Status: DC | PRN

## 2022-04-30 MED ORDER — OXYCODONE HCL 5 MG PO TABS
ORAL_TABLET | ORAL | Status: AC
  Filled 2022-04-30: qty 1

## 2022-04-30 MED ORDER — FENTANYL CITRATE (PF) 250 MCG/5ML IJ SOLN
INTRAMUSCULAR | Status: DC | PRN
  Administered 2022-04-30: 50 ug via INTRAVENOUS
  Administered 2022-04-30: 100 ug via INTRAVENOUS

## 2022-04-30 SURGICAL SUPPLY — 40 items
BAG COUNTER SPONGE SURGICOUNT (BAG) ×2 IMPLANT
BLADE CLIPPER SURG (BLADE) IMPLANT
CANISTER SUCT 3000ML PPV (MISCELLANEOUS) ×2 IMPLANT
CHLORAPREP W/TINT 26 (MISCELLANEOUS) ×2 IMPLANT
COVER SURGICAL LIGHT HANDLE (MISCELLANEOUS) ×2 IMPLANT
DERMABOND ADVANCED .7 DNX12 (GAUZE/BANDAGES/DRESSINGS) ×2 IMPLANT
DRAIN PENROSE 0.5X18 (DRAIN) IMPLANT
DRAPE LAPAROTOMY 100X72 PEDS (DRAPES) ×2 IMPLANT
ELECT REM PT RETURN 9FT ADLT (ELECTROSURGICAL) ×2
ELECTRODE REM PT RTRN 9FT ADLT (ELECTROSURGICAL) ×2 IMPLANT
GLOVE BIO SURGEON STRL SZ8 (GLOVE) ×2 IMPLANT
GLOVE BIOGEL PI IND STRL 8 (GLOVE) ×2 IMPLANT
GOWN STRL REUS W/ TWL LRG LVL3 (GOWN DISPOSABLE) ×2 IMPLANT
GOWN STRL REUS W/ TWL XL LVL3 (GOWN DISPOSABLE) ×2 IMPLANT
GOWN STRL REUS W/TWL LRG LVL3 (GOWN DISPOSABLE) ×4
GOWN STRL REUS W/TWL XL LVL3 (GOWN DISPOSABLE) ×2
KIT BASIN OR (CUSTOM PROCEDURE TRAY) ×2 IMPLANT
KIT TURNOVER KIT B (KITS) ×2 IMPLANT
MESH HERNIA 3X6 (Mesh General) IMPLANT
MESH VENTRALEX ST 1-7/10 CRC S (Mesh General) IMPLANT
NDL 22X1.5 STRL (OR ONLY) (MISCELLANEOUS) ×2 IMPLANT
NEEDLE 22X1.5 STRL (OR ONLY) (MISCELLANEOUS) ×2 IMPLANT
NS IRRIG 1000ML POUR BTL (IV SOLUTION) ×2 IMPLANT
PACK GENERAL/GYN (CUSTOM PROCEDURE TRAY) ×2 IMPLANT
PAD ARMBOARD 7.5X6 YLW CONV (MISCELLANEOUS) ×2 IMPLANT
SUT MNCRL AB 4-0 PS2 18 (SUTURE) ×2 IMPLANT
SUT MON AB 5-0 PS2 18 (SUTURE) IMPLANT
SUT PROLENE 0 CT 1 30 (SUTURE) ×4 IMPLANT
SUT PROLENE 2 0 CT2 30 (SUTURE) IMPLANT
SUT SILK 2 0 SH CR/8 (SUTURE) IMPLANT
SUT VIC AB 2-0 CT1 27 (SUTURE) ×2
SUT VIC AB 2-0 CT1 TAPERPNT 27 (SUTURE) ×2 IMPLANT
SUT VIC AB 2-0 SH 27 (SUTURE) ×2
SUT VIC AB 2-0 SH 27X BRD (SUTURE) ×2 IMPLANT
SUT VIC AB 3-0 SH 27 (SUTURE) ×4
SUT VIC AB 3-0 SH 27X BRD (SUTURE) ×2 IMPLANT
SUT VIC AB 3-0 SH 27XBRD (SUTURE) ×2 IMPLANT
SYR CONTROL 10ML LL (SYRINGE) ×2 IMPLANT
TOWEL GREEN STERILE (TOWEL DISPOSABLE) ×2 IMPLANT
TOWEL GREEN STERILE FF (TOWEL DISPOSABLE) ×2 IMPLANT

## 2022-04-30 NOTE — H&P (Signed)
Christopher Maxwell is an 51 y.o. male.   Chief Complaint: UH and RIH HPI: Patient with repair of umbilical hernia with mesh and repair of right inguinal hernia with mesh.  Symptoms have been similar since I saw him in the office recently.  Past Medical History:  Diagnosis Date   Essential hypertension 07/28/2019   Fracture    R posterior ribs x 4   MVP (mitral valve prolapse)    Had as a child, no longer present   Plantar fasciitis, bilateral    Scoliosis    Tennis elbow 2022    Past Surgical History:  Procedure Laterality Date   VASECTOMY     WISDOM TOOTH EXTRACTION      Family History  Problem Relation Age of Onset   Cancer Mother    Diabetes Father    Social History:  reports that he quit smoking about 11 years ago. His smoking use included cigarettes. He has never used smokeless tobacco. He reports that he does not currently use alcohol after a past usage of about 7.0 standard drinks of alcohol per week. He reports that he does not use drugs.  Allergies:  Allergies  Allergen Reactions   Azithromycin Other (See Comments)    Hypothermia, diaphoresis, weakness   Penicillin G Shortness Of Breath   Penicillins Shortness Of Breath    Medications Prior to Admission  Medication Sig Dispense Refill   cetirizine (ZYRTEC) 10 MG tablet Take 10 mg by mouth daily.     Cholecalciferol (VITAMIN D) 50 MCG (2000 UT) CAPS Take 2,000 Units by mouth daily.     fluticasone (FLONASE) 50 MCG/ACT nasal spray Place 1 spray into both nostrils daily as needed for rhinitis.     ibuprofen (ADVIL) 200 MG tablet Take 400 mg by mouth every 6 (six) hours as needed for mild pain or moderate pain.     naphazoline-pheniramine (VISINE) 0.025-0.3 % ophthalmic solution Place 1 drop into both eyes daily as needed for eye irritation.     phenylephrine-shark liver oil-mineral oil-petrolatum (PREPARATION H) 0.25-14-74.9 % rectal ointment Place 1 Application rectally 2 (two) times daily as needed for hemorrhoids.      polyethylene glycol (MIRALAX / GLYCOLAX) 17 g packet Take 17 g by mouth daily.     celecoxib (CELEBREX) 200 MG capsule Take 1 capsule (200 mg total) by mouth daily. With food (Patient not taking: Reported on 03/28/2022) 30 capsule 5    No results found for this or any previous visit (from the past 48 hour(s)). No results found.  Review of Systems  Blood pressure (!) 129/95, pulse (!) 118, temperature 98.3 F (36.8 C), resp. rate 17, height 5\' 11"  (1.803 m), weight 78.9 kg, SpO2 98 %. Physical Exam HENT:     Head: Normocephalic.  Cardiovascular:     Rate and Rhythm: Normal rate and regular rhythm.     Pulses: Normal pulses.     Heart sounds: Normal heart sounds.  Pulmonary:     Effort: Pulmonary effort is normal.     Breath sounds: Normal breath sounds.  Abdominal:     General: Abdomen is flat.     Palpations: Abdomen is soft.     Comments: Umbilical hernia is reducible  Genitourinary:    Comments: Right inguinal hernia is reducible Musculoskeletal:        General: Normal range of motion.  Skin:    Capillary Refill: Capillary refill takes less than 2 seconds.  Neurological:     Mental Status: He is  oriented to person, place, and time.      Assessment/Plan Umbilical hernia and right inguinal hernia -for repair of umbilical hernia with mesh and repair of right inguinal hernia with mesh.  Procedure, risks, and benefits were discussed in detail with him.  He is agreeable.  I also discussed the expected postoperative course.  Zenovia Jarred, MD 04/30/2022, 10:31 AM

## 2022-04-30 NOTE — Anesthesia Procedure Notes (Signed)
Procedure Name: Intubation Date/Time: 04/30/2022 10:48 AM  Performed by: Janace Litten, CRNAPre-anesthesia Checklist: Patient identified, Emergency Drugs available, Suction available and Patient being monitored Patient Re-evaluated:Patient Re-evaluated prior to induction Oxygen Delivery Method: Circle System Utilized Preoxygenation: Pre-oxygenation with 100% oxygen Induction Type: IV induction Ventilation: Mask ventilation without difficulty Laryngoscope Size: Mac and 4 Tube type: Oral Tube size: 7.5 mm Number of attempts: 1 Airway Equipment and Method: Stylet and Oral airway Placement Confirmation: ETT inserted through vocal cords under direct vision, positive ETCO2 and breath sounds checked- equal and bilateral Secured at: 24 cm Tube secured with: Tape Dental Injury: Teeth and Oropharynx as per pre-operative assessment  Comments: Performed by Janalyn Shy

## 2022-04-30 NOTE — Op Note (Signed)
04/30/2022  12:04 PM  PATIENT:  Christopher Maxwell  51 y.o. male  PRE-OPERATIVE DIAGNOSIS:  RIGHT INGUINAL HERNIA, UMBILICAL HERNIA  POST-OPERATIVE DIAGNOSIS:  RIGHT INGUINAL HERNIA, UMBILICAL HERNIA  PROCEDURE:  Procedure(s): OPEN UMBILICAL HERNIA REPAIR WITH MESH - DEFECT 2CM OPEN RIGHT INGUINAL HERNIA REPAIR WITH MESH - DEFECT 3CM    SURGEON:  Surgeon(s): Violeta Gelinas, MD  ASSISTANTS: Jenna Luo   ANESTHESIA:   local, regional, and general  EBL:  Total I/O In: 1000 [I.V.:1000] Out: 30 [Blood:30]  BLOOD ADMINISTERED:none  DRAINS: none   SPECIMEN:  No Specimen  DISPOSITION OF SPECIMEN:  N/A  COUNTS:  YES  DICTATION: .Dragon Dictation Procedure in detail: Informed consent was obtained.  He received intravenous antibiotics.  His sites were both marked.  He was brought to the operating room and general endotracheal anesthesia was administered by the anesthesia staff.  He underwent a TAP block by anesthesia.  His abdomen and groins were prepped and draped in a sterile fashion.  We did a timeout procedure.  Infraumbilical region was injected with a small amount of local.  Infraumbilical incision was made.  Subcutaneous tissues were dissected down carefully.  The umbilical stalk was dissected free off of the underlying fascia and the hernia was exposed.  It contained only omentum.  This small piece of chronically incarcerated omentum was excised.  Excellent hemostasis was obtained.  The fascia was cleared off circumferentially.  The fascial defect was 2 cm.  We completed the hernia repair with a 4.3 cm Ventralex mesh.  The straps were tacked down superiorly and inferiorly to the fascia with interrupted 2-0 Prolene sutures.  We then placed additional 2-0 Prolene sutures to close the fascia over the mesh in an inlay fashion using 2-0 interrupted Prolene.  There is copiously irrigated.  The umbilical skin was tacked back down with 2-0 Vicryl.  Subcutaneous tissues were closed with  interrupted 3-0 Vicryl.  Skin was closed with 4-0 Monocryl followed by Dermabond.  Attention was directed to the inguinal region.  A small amount of local was injected and a right inguinal incision was made.  Subcutaneous tissues were dissected down through Scarpa's fascia achieving excellent hemostasis.  We exposed the external bleak fascia.  This was a bit attenuated medially due to the hernia.  We divided the external oblique laterally and the division was continued down towards the external ring.  The superior leaflet was dissected free off the transversalis and the inferior leaflet was dissected down revealing the shelving edge of the inguinal ligament.  We encircled the cord structures with a Penrose drain.  Exploration of the cord and the floor revealed a direct inguinal hernia.  The hernia defect was about 3 cm.  The hernia was reduced.  We placed 2 interrupted 2-0 Vicryl's between the shelving edge of the inguinal ligament and the transversalis to keep the hernia reduced to facilitate our repair.  Next, the hernia was repaired with a keyhole polypropylene mesh cut to custom size and shape.  This was tacked to the shelving edge of the inguinal ligament inferiorly in a running fashion.  The mesh was then tacked to the transversalis superiorly with interrupted 2-0 Prolene's.  The 2 leaflets were tacked together behind the cord and tucked out laterally underneath the external oblique.  These were tacked together and to the underlying musculature with 2-0 Prolene.  The area was copiously irrigated hemostasis was ensured.  The aperture and the mesh was good without significant cord compression.  Hemostasis was  ensured.  The external bleak fascia was closed with running 2-0 Vicryl.  The subcutaneous tissues were approximated with interrupted 3-0 Vicryl.  The skin was closed with 4-0 Monocryl subcuticular followed by Dermabond.  All counts were correct.  He tolerated the procedures well without apparent  complication and was taken recovery in stable condition. PATIENT DISPOSITION:  PACU - hemodynamically stable.   Delay start of Pharmacological VTE agent (>24hrs) due to surgical blood loss or risk of bleeding:  yes  Georganna Skeans, MD, MPH, FACS Pager: [phone]  10/17/202312:04 PM

## 2022-04-30 NOTE — Anesthesia Preprocedure Evaluation (Addendum)
Anesthesia Evaluation  Patient identified by MRN, date of birth, ID band Patient awake    Reviewed: Allergy & Precautions, NPO status , Patient's Chart, lab work & pertinent test results  History of Anesthesia Complications Negative for: history of anesthetic complications  Airway Mallampati: I  TM Distance: >3 FB Neck ROM: Full    Dental  (+) Teeth Intact, Dental Advisory Given   Pulmonary former smoker,    breath sounds clear to auscultation       Cardiovascular hypertension,  Rhythm:Regular     Neuro/Psych negative neurological ROS  negative psych ROS   GI/Hepatic   Endo/Other    Renal/GU      Musculoskeletal   Abdominal   Peds  Hematology   Anesthesia Other Findings   Reproductive/Obstetrics                            Anesthesia Physical Anesthesia Plan  ASA: 2  Anesthesia Plan: General and Regional   Post-op Pain Management: Tylenol PO (pre-op)*, Gabapentin PO (pre-op)*, Celebrex PO (pre-op)* and Regional block*   Induction: Intravenous  PONV Risk Score and Plan: 2 and Ondansetron, Dexamethasone and Midazolam  Airway Management Planned: LMA and Oral ETT  Additional Equipment: None  Intra-op Plan:   Post-operative Plan: Extubation in OR  Informed Consent: I have reviewed the patients History and Physical, chart, labs and discussed the procedure including the risks, benefits and alternatives for the proposed anesthesia with the patient or authorized representative who has indicated his/her understanding and acceptance.     Dental advisory given  Plan Discussed with: CRNA  Anesthesia Plan Comments:         Anesthesia Quick Evaluation

## 2022-04-30 NOTE — Transfer of Care (Signed)
Immediate Anesthesia Transfer of Care Note  Patient: Christopher Maxwell  Procedure(s) Performed: OPEN RIGHT INGUINAL HERNIA REPAIR (Right: Inguinal) OPEN UMBILICAL HERNIA REPAIR (Abdomen) INSERTION OF MESH (Abdomen)  Patient Location: PACU  Anesthesia Type:GA combined with regional for post-op pain  Level of Consciousness: drowsy and patient cooperative  Airway & Oxygen Therapy: Patient Spontanous Breathing  Post-op Assessment: Report given to RN and Post -op Vital signs reviewed and stable  Post vital signs: Reviewed and stable  Last Vitals:  Vitals Value Taken Time  BP 112/71 04/30/22 1215  Temp 36.2 C 04/30/22 1215  Pulse 81 04/30/22 1220  Resp 14 04/30/22 1220  SpO2 94 % 04/30/22 1220  Vitals shown include unvalidated device data.  Last Pain:  Vitals:   04/30/22 0853  PainSc: 0-No pain         Complications: No notable events documented.

## 2022-05-01 ENCOUNTER — Encounter (HOSPITAL_COMMUNITY): Payer: Self-pay | Admitting: General Surgery

## 2022-05-02 MED ORDER — BUPIVACAINE-EPINEPHRINE (PF) 0.5% -1:200000 IJ SOLN
INTRAMUSCULAR | Status: DC | PRN
  Administered 2022-04-30: 30 mL

## 2022-05-02 NOTE — Anesthesia Postprocedure Evaluation (Signed)
Anesthesia Post Note  Patient: Christopher Maxwell  Procedure(s) Performed: OPEN RIGHT INGUINAL HERNIA REPAIR (Right: Inguinal) OPEN UMBILICAL HERNIA REPAIR (Abdomen) INSERTION OF MESH (Abdomen)     Patient location during evaluation: PACU Anesthesia Type: Regional and General Level of consciousness: awake and alert Pain management: pain level controlled Vital Signs Assessment: post-procedure vital signs reviewed and stable Respiratory status: spontaneous breathing, nonlabored ventilation and respiratory function stable Cardiovascular status: blood pressure returned to baseline and stable Postop Assessment: no apparent nausea or vomiting Anesthetic complications: no   No notable events documented.  Last Vitals:  Vitals:   04/30/22 1245 04/30/22 1300  BP: 123/84 116/83  Pulse: 82 79  Resp: 15 14  Temp:  36.7 C  SpO2: 94% 96%    Last Pain:  Vitals:   04/30/22 1300  PainSc: 3                  Kattaleya Alia

## 2022-05-02 NOTE — Anesthesia Procedure Notes (Signed)
Anesthesia Regional Block: TAP block   Pre-Anesthetic Checklist: , timeout performed,  Correct Patient, Correct Site, Correct Laterality,  Correct Procedure, Correct Position, site marked,  Risks and benefits discussed,  Surgical consent,  Pre-op evaluation,  At surgeon's request and post-op pain management  Laterality: Right  Prep: chloraprep       Needles:  Injection technique: Single-shot      Needle Length: 9cm  Needle Gauge: 22     Additional Needles: Arrow StimuQuik ECHO Echogenic Stimulating PNB Needle  Procedures:,,,, ultrasound used (permanent image in chart),,    Narrative:  Start time: 04/30/2022 10:34 AM End time: 04/30/2022 10:40 AM Injection made incrementally with aspirations every 5 mL.  Performed by: Personally  Anesthesiologist: Oleta Mouse, MD

## 2022-06-13 ENCOUNTER — Ambulatory Visit (INDEPENDENT_AMBULATORY_CARE_PROVIDER_SITE_OTHER): Payer: BC Managed Care – PPO | Admitting: Family Medicine

## 2022-06-13 ENCOUNTER — Encounter: Payer: Self-pay | Admitting: Family Medicine

## 2022-06-13 DIAGNOSIS — J069 Acute upper respiratory infection, unspecified: Secondary | ICD-10-CM

## 2022-06-13 LAB — VERITOR FLU A/B WAIVED
Influenza A: NEGATIVE
Influenza B: NEGATIVE

## 2022-06-13 NOTE — Progress Notes (Signed)
   Virtual Visit  Note Due to COVID-19 pandemic this visit was conducted virtually. This visit type was conducted due to national recommendations for restrictions regarding the COVID-19 Pandemic (e.g. social distancing, sheltering in place) in an effort to limit this patient's exposure and mitigate transmission in our community. All issues noted in this document were discussed and addressed.  A physical exam was not performed with this format.  I connected with Christopher Maxwell on 06/13/22 at 1:19 by telephone and verified that I am speaking with the correct person using two identifiers. Christopher Maxwell is currently located at home and no one is currently with him during the visit. The provider, Gabriel Earing, FNP is located in their office at time of visit.  I discussed the limitations, risks, security and privacy concerns of performing an evaluation and management service by telephone and the availability of in person appointments. I also discussed with the patient that there may be a patient responsible charge related to this service. The patient expressed understanding and agreed to proceed.  CC: URI  History and Present Illness:  URI  This is a new problem. The current episode started yesterday. The problem has been gradually worsening. There has been no fever. Associated symptoms include abdominal pain, congestion, diarrhea, headaches, joint pain, nausea and a sore throat. Pertinent negatives include no chest pain, coughing, ear pain, vomiting or wheezing. Associated symptoms comments: chills. He has tried acetaminophen (zyrtec) for the symptoms. The treatment provided mild relief.  He had a negative home covid test this morning.     Review of Systems  HENT:  Positive for congestion and sore throat. Negative for ear pain.   Respiratory:  Negative for cough and wheezing.   Cardiovascular:  Negative for chest pain.  Gastrointestinal:  Positive for abdominal pain, diarrhea and nausea.  Negative for vomiting.  Musculoskeletal:  Positive for joint pain.  Neurological:  Positive for headaches.     Observations/Objective: Alert and oriented x 3. Able to speak in full sentences without difficulty.    Assessment and Plan: Christopher Maxwell was seen today for uri.  Diagnoses and all orders for this visit:  Acute URI Negative rapid flu today. He will take another home Covid test tomorrow, declined Covid PCR today. Discussed symptomatic care and return precautions.  -     Veritor Flu A/B Waived     Follow Up Instructions: As needed.     I discussed the assessment and treatment plan with the patient. The patient was provided an opportunity to ask questions and all were answered. The patient agreed with the plan and demonstrated an understanding of the instructions.   The patient was advised to call back or seek an in-person evaluation if the symptoms worsen or if the condition fails to improve as anticipated.  The above assessment and management plan was discussed with the patient. The patient verbalized understanding of and has agreed to the management plan. Patient is aware to call the clinic if symptoms persist or worsen. Patient is aware when to return to the clinic for a follow-up visit. Patient educated on when it is appropriate to go to the emergency department.   Time call ended:  1330  I provided 11 minutes of  non face-to-face time during this encounter.    Gabriel Earing, FNP

## 2022-06-17 ENCOUNTER — Telehealth: Payer: Self-pay | Admitting: Family Medicine

## 2022-06-17 DIAGNOSIS — R110 Nausea: Secondary | ICD-10-CM

## 2022-06-17 DIAGNOSIS — J069 Acute upper respiratory infection, unspecified: Secondary | ICD-10-CM

## 2022-06-17 MED ORDER — CHLORPHEN-PE-ACETAMINOPHEN 4-10-325 MG PO TABS: 1.0000 | ORAL_TABLET | Freq: Four times a day (QID) | ORAL | 0 refills | Status: DC | PRN

## 2022-06-17 MED ORDER — FLUTICASONE PROPIONATE 50 MCG/ACT NA SUSP: 1.0000 | Freq: Every day | NASAL | 0 refills | Status: DC | PRN

## 2022-06-17 MED ORDER — ONDANSETRON HCL 4 MG PO TABS: 4.0000 mg | ORAL_TABLET | Freq: Three times a day (TID) | ORAL | 0 refills | Status: DC | PRN

## 2022-06-17 NOTE — Telephone Encounter (Signed)
Patient aware.

## 2022-06-17 NOTE — Telephone Encounter (Signed)
Patient took at home COVID test on Friday 12/1 after being tested here for flu and both were negative. He is still having headache, nasal congestion, runny nose and upset stomach. If something can be called in, please send to [facility] in [place]. Call back and advise.

## 2022-06-17 NOTE — Telephone Encounter (Signed)
I have sent in Norel AD that he can take instead of OTC decongestants and flonase for nasal congestion. I have also sent in zofran prn for nausea/vomiting. If he is having diarrhea, he can take imodium OTC.

## 2022-07-07 DIAGNOSIS — B349 Viral infection, unspecified: Secondary | ICD-10-CM | POA: Diagnosis not present

## 2022-09-12 ENCOUNTER — Encounter: Payer: Self-pay | Admitting: Radiology

## 2022-12-05 ENCOUNTER — Ambulatory Visit: Payer: BC Managed Care – PPO | Admitting: Family Medicine

## 2022-12-05 ENCOUNTER — Encounter: Payer: Self-pay | Admitting: Family Medicine

## 2022-12-05 ENCOUNTER — Ambulatory Visit (INDEPENDENT_AMBULATORY_CARE_PROVIDER_SITE_OTHER): Payer: BC Managed Care – PPO

## 2022-12-05 VITALS — BP 135/88 | HR 78 | Temp 97.8°F | Ht 71.0 in | Wt 174.4 lb

## 2022-12-05 DIAGNOSIS — R5383 Other fatigue: Secondary | ICD-10-CM | POA: Diagnosis not present

## 2022-12-05 DIAGNOSIS — R5381 Other malaise: Secondary | ICD-10-CM

## 2022-12-05 DIAGNOSIS — R0609 Other forms of dyspnea: Secondary | ICD-10-CM

## 2022-12-05 DIAGNOSIS — R6889 Other general symptoms and signs: Secondary | ICD-10-CM | POA: Diagnosis not present

## 2022-12-05 DIAGNOSIS — R0600 Dyspnea, unspecified: Secondary | ICD-10-CM | POA: Diagnosis not present

## 2022-12-05 NOTE — Progress Notes (Signed)
Subjective:  Patient ID: Christopher Maxwell, male    DOB: 1971/07/12, 52 y.o.   MRN: [id number]  Patient Care Team: Mechele Claude, MD as PCP - General (Family Medicine)   Chief Complaint:  Shortness of Breath and Fatigue (Patient noticed x 1 week and he is also experiencing more stress. )   HPI: Christopher Maxwell is a 52 y.o. male presenting on 12/05/2022 for Shortness of Breath and Fatigue (Patient noticed x 1 week and he is also experiencing more stress. )   Pt presents today for evaluation fo DOE and fatigue. This has been ongoing for a week. States he has been under significant stress at work which may be contributing to symptoms.   Shortness of Breath This is a new problem. Episode onset: 1 week ago. The problem occurs intermittently. The problem has been waxing and waning. Pertinent negatives include no abdominal pain, chest pain, claudication, coryza, ear pain, fever, headaches, hemoptysis, leg pain, leg swelling, neck pain, orthopnea, PND, rash, rhinorrhea, sore throat, sputum production, swollen glands, syncope, vomiting or wheezing. The symptoms are aggravated by any activity (stress). The patient has no known risk factors for DVT/PE. He has tried nothing for the symptoms.      Relevant past medical, surgical, family, and social history reviewed and updated as indicated.  Allergies and medications reviewed and updated. Data reviewed: Chart in Epic.   Past Medical History:  Diagnosis Date   Essential hypertension 07/28/2019   Fracture    R posterior ribs x 4   MVP (mitral valve prolapse)    Had as a child, no longer present   Plantar fasciitis, bilateral    Scoliosis    Tennis elbow 2022    Past Surgical History:  Procedure Laterality Date   INGUINAL HERNIA REPAIR Right 04/30/2022   Procedure: OPEN RIGHT INGUINAL HERNIA REPAIR;  Surgeon: Violeta Gelinas, MD;  Location: [facility] OR;  Service: General;  Laterality: Right;   INSERTION OF MESH N/A 04/30/2022   Procedure:  INSERTION OF MESH;  Surgeon: Violeta Gelinas, MD;  Location: [facility] OR;  Service: General;  Laterality: N/A;  umbilical and left inguinal   UMBILICAL HERNIA REPAIR N/A 04/30/2022   Procedure: OPEN UMBILICAL HERNIA REPAIR;  Surgeon: Violeta Gelinas, MD;  Location: [facility] OR;  Service: General;  Laterality: N/A;   VASECTOMY     WISDOM TOOTH EXTRACTION      Social History   Socioeconomic History   Marital status: Married    Spouse name: Peggy   Number of children: 1   Years of education: Not on file   Highest education level: Not on file  Occupational History   Not on file  Tobacco Use   Smoking status: Former    Types: Cigarettes    Quit date: 04/15/2011    Years since quitting: 11.6   Smokeless tobacco: Never  Vaping Use   Vaping Use: Never used  Substance and Sexual Activity   Alcohol use: Not Currently    Alcohol/week: 7.0 standard drinks of alcohol    Types: 7 Glasses of wine per week    Comment: SOCIAL   Drug use: No   Sexual activity: Yes  Other Topics Concern   Not on file  Social History Narrative   Daughter Dahlia Client   Social Determinants of Health   Financial Resource Strain: Not on file  Food Insecurity: Not on file  Transportation Needs: Not on file  Physical Activity: Not on file  Stress: Not on file  Social  Connections: Not on file  Intimate Partner Violence: Not on file    Outpatient Encounter Medications as of 12/05/2022  Medication Sig   Cholecalciferol (VITAMIN D) 50 MCG (2000 UT) CAPS Take 2,000 Units by mouth daily.   fluticasone (FLONASE) 50 MCG/ACT nasal spray Place 1 spray into both nostrils daily as needed for rhinitis.   ibuprofen (ADVIL) 200 MG tablet Take 400 mg by mouth every 6 (six) hours as needed for mild pain or moderate pain.   cetirizine (ZYRTEC) 10 MG tablet Take 10 mg by mouth daily. (Patient not taking: Reported on 12/05/2022)   Chlorphen-PE-Acetaminophen 4-10-325 MG TABS Take 1 tablet by mouth every 6 (six) hours as needed. (Patient not  taking: Reported on 12/05/2022)   naphazoline-pheniramine (VISINE) 0.025-0.3 % ophthalmic solution Place 1 drop into both eyes daily as needed for eye irritation. (Patient not taking: Reported on 12/05/2022)   ondansetron (ZOFRAN) 4 MG tablet Take 1 tablet (4 mg total) by mouth every 8 (eight) hours as needed for nausea or vomiting. (Patient not taking: Reported on 12/05/2022)   phenylephrine-shark liver oil-mineral oil-petrolatum (PREPARATION H) 0.25-14-74.9 % rectal ointment Place 1 Application rectally 2 (two) times daily as needed for hemorrhoids. (Patient not taking: Reported on 12/05/2022)   polyethylene glycol (MIRALAX / GLYCOLAX) 17 g packet Take 17 g by mouth daily. (Patient not taking: Reported on 12/05/2022)   No facility-administered encounter medications on file as of 12/05/2022.    Allergies  Allergen Reactions   Azithromycin Other (See Comments)    Hypothermia, diaphoresis, weakness   Penicillin G Shortness Of Breath   Penicillins Shortness Of Breath    Review of Systems  Constitutional:  Positive for activity change and fatigue. Negative for appetite change, chills, diaphoresis, fever and unexpected weight change.  HENT: Negative.  Negative for congestion, ear pain, rhinorrhea and sore throat.   Eyes: Negative.   Respiratory:  Positive for shortness of breath. Negative for apnea, cough, hemoptysis, sputum production, choking, chest tightness, wheezing and stridor.   Cardiovascular:  Negative for chest pain, palpitations, orthopnea, claudication, leg swelling, syncope and PND.  Gastrointestinal:  Negative for abdominal distention, abdominal pain, blood in stool, constipation, diarrhea, nausea and vomiting.  Endocrine: Negative.   Genitourinary:  Negative for decreased urine volume, difficulty urinating, dysuria, frequency and urgency.  Musculoskeletal:  Negative for arthralgias, myalgias and neck pain.  Skin: Negative.  Negative for rash.  Allergic/Immunologic: Negative.    Neurological:  Negative for dizziness, tremors, seizures, syncope, facial asymmetry, speech difficulty, weakness, light-headedness, numbness and headaches.  Hematological: Negative.   Psychiatric/Behavioral:  Negative for confusion, hallucinations, sleep disturbance and suicidal ideas.   All other systems reviewed and are negative.       Objective:  BP 135/88   Pulse 78   Temp 97.8 F (36.6 C) (Temporal)   Ht 5\' 11"  (1.803 m)   Wt 174 lb 6.4 oz (79.1 kg)   SpO2 96%   BMI 24.32 kg/m    Wt Readings from Last 3 Encounters:  12/05/22 174 lb 6.4 oz (79.1 kg)  04/30/22 174 lb (78.9 kg)  04/23/22 174 lb 3.2 oz (79 kg)    Physical Exam Vitals and nursing note reviewed.  Constitutional:      General: He is not in acute distress.    Appearance: Normal appearance. He is well-developed, well-groomed and normal weight. He is not ill-appearing, toxic-appearing or diaphoretic.  HENT:     Head: Normocephalic and atraumatic.     Jaw: There is normal jaw occlusion.  Right Ear: Hearing normal.     Left Ear: Hearing normal.     Nose: Nose normal.     Mouth/Throat:     Lips: Pink.     Mouth: Mucous membranes are moist.     Pharynx: Oropharynx is clear. Uvula midline.  Eyes:     General: Lids are normal.     Extraocular Movements: Extraocular movements intact.     Conjunctiva/sclera: Conjunctivae normal.     Pupils: Pupils are equal, round, and reactive to light.  Neck:     Thyroid: No thyroid mass, thyromegaly or thyroid tenderness.     Vascular: No carotid bruit or JVD.     Trachea: Trachea and phonation normal.  Cardiovascular:     Rate and Rhythm: Normal rate and regular rhythm.     Chest Wall: PMI is not displaced.     Pulses: Normal pulses.     Heart sounds: Normal heart sounds. No murmur heard.    No friction rub. No gallop.  Pulmonary:     Effort: Pulmonary effort is normal. No respiratory distress.     Breath sounds: Normal breath sounds. No wheezing or rhonchi.   Abdominal:     General: Bowel sounds are normal. There is no distension or abdominal bruit.     Palpations: Abdomen is soft. There is no hepatomegaly or splenomegaly.     Tenderness: There is no abdominal tenderness. There is no right CVA tenderness or left CVA tenderness.     Hernia: No hernia is present.  Musculoskeletal:        General: Normal range of motion.     Cervical back: Normal range of motion and neck supple.     Right lower leg: No edema.     Left lower leg: No edema.  Lymphadenopathy:     Cervical: No cervical adenopathy.  Skin:    General: Skin is warm and dry.     Capillary Refill: Capillary refill takes less than 2 seconds.     Coloration: Skin is not cyanotic, jaundiced or pale.     Findings: No rash.  Neurological:     General: No focal deficit present.     Mental Status: He is alert and oriented to person, place, and time.     Sensory: Sensation is intact.     Motor: Motor function is intact.     Coordination: Coordination is intact.     Gait: Gait is intact.     Deep Tendon Reflexes: Reflexes are normal and symmetric.  Psychiatric:        Attention and Perception: Attention and perception normal.        Mood and Affect: Mood and affect normal.        Speech: Speech normal.        Behavior: Behavior normal. Behavior is cooperative.        Thought Content: Thought content normal.        Cognition and Memory: Cognition and memory normal.        Judgment: Judgment normal.     Results for orders placed or performed in visit on 12/05/22  Brain natriuretic peptide  Result Value Ref Range   BNP WILL FOLLOW   CBC with Differential/Platelet  Result Value Ref Range   WBC 4.5 3.4 - 10.8 x10E3/uL   RBC 5.51 4.14 - 5.80 x10E6/uL   Hemoglobin 15.7 13.0 - 17.7 g/dL   Hematocrit 16.1 09.6 - 51.0 %   MCV 86 79 - 97 fL  MCH 28.5 26.6 - 33.0 pg   MCHC 33.2 31.5 - 35.7 g/dL   RDW 16.1 09.6 - 04.5 %   Platelets 244 150 - 450 x10E3/uL   Neutrophils 56 Not Estab. %    Lymphs 32 Not Estab. %   Monocytes 8 Not Estab. %   Eos 3 Not Estab. %   Basos 1 Not Estab. %   Neutrophils Absolute 2.5 1.4 - 7.0 x10E3/uL   Lymphocytes Absolute 1.5 0.7 - 3.1 x10E3/uL   Monocytes Absolute 0.4 0.1 - 0.9 x10E3/uL   EOS (ABSOLUTE) 0.2 0.0 - 0.4 x10E3/uL   Basophils Absolute 0.1 0.0 - 0.2 x10E3/uL   Immature Granulocytes 0 Not Estab. %   Immature Grans (Abs) 0.0 0.0 - 0.1 x10E3/uL  CMP14+EGFR  Result Value Ref Range   Glucose WILL FOLLOW    BUN WILL FOLLOW    Creatinine, Ser WILL FOLLOW    eGFR WILL FOLLOW    BUN/Creatinine Ratio WILL FOLLOW    Sodium WILL FOLLOW    Potassium WILL FOLLOW    Chloride WILL FOLLOW    CO2 WILL FOLLOW    Calcium WILL FOLLOW    Total Protein WILL FOLLOW    Albumin WILL FOLLOW    Globulin, Total WILL FOLLOW    Albumin/Globulin Ratio WILL FOLLOW    Bilirubin Total WILL FOLLOW    Alkaline Phosphatase WILL FOLLOW    AST WILL FOLLOW    ALT WILL FOLLOW   Thyroid Panel With TSH  Result Value Ref Range   TSH WILL FOLLOW    T4, Total WILL FOLLOW    T3 Uptake Ratio WILL FOLLOW    Free Thyroxine Index WILL FOLLOW   VITAMIN D 25 Hydroxy (Vit-D Deficiency, Fractures)  Result Value Ref Range   Vit D, 25-Hydroxy WILL FOLLOW      X-Ray: CXR: No acute findings. Preliminary x-ray reading by Kari Baars, FNP-C, [facility].  EKG: SR 71, PR 164 ms, QT 358 ms, no acute ST-T changes, no significant changes from prior EKG. Kari Baars, FNP-C  Pertinent labs & imaging results that were available during my care of the patient were reviewed by me and considered in my medical decision making.  Assessment & Plan:  Terry was seen today for shortness of breath and fatigue.  Diagnoses and all orders for this visit:  DOE (dyspnea on exertion) Malaise and fatigue No indications of ACS. EKG and CXR without acute findings. Will obtain labs to evaluate for potential underlying causes such as anemia or electrolyte disturbances. Pt aware of red flags  which require emergent evaluation and treatment. Aware to male follow up with PCP if symptoms persist or worsen.  -     Brain natriuretic peptide -     CBC with Differential/Platelet -     CMP14+EGFR -     Thyroid Panel With TSH -     VITAMIN D 25 Hydroxy (Vit-D Deficiency, Fractures) -     EKG 12-Lead -     DG Chest 2 View; Future     Continue all other maintenance medications.  Follow up plan: Return in about 4 weeks (around 01/02/2023), or if symptoms worsen or fail to improve, for PCP for regular follow up.   Continue healthy lifestyle choices, including diet (rich in fruits, vegetables, and lean proteins, and low in salt and simple carbohydrates) and exercise (at least 30 minutes of moderate physical activity daily).  Educational handout given for fatigue  The above assessment and management plan was discussed  with the patient. The patient verbalized understanding of and has agreed to the management plan. Patient is aware to call the clinic if they develop any new symptoms or if symptoms persist or worsen. Patient is aware when to return to the clinic for a follow-up visit. Patient educated on when it is appropriate to go to the emergency department.   Kari Baars, FNP-C [facility] [phone]

## 2022-12-06 LAB — CBC WITH DIFFERENTIAL/PLATELET
Basophils Absolute: 0.1 10*3/uL (ref 0.0–0.2)
Basos: 1 %
EOS (ABSOLUTE): 0.2 10*3/uL (ref 0.0–0.4)
Eos: 3 %
Hematocrit: 47.3 % (ref 37.5–51.0)
Hemoglobin: 15.7 g/dL (ref 13.0–17.7)
Immature Grans (Abs): 0 10*3/uL (ref 0.0–0.1)
Immature Granulocytes: 0 %
Lymphocytes Absolute: 1.5 10*3/uL (ref 0.7–3.1)
Lymphs: 32 %
MCH: 28.5 pg (ref 26.6–33.0)
MCHC: 33.2 g/dL (ref 31.5–35.7)
MCV: 86 fL (ref 79–97)
Monocytes Absolute: 0.4 10*3/uL (ref 0.1–0.9)
Monocytes: 8 %
Neutrophils Absolute: 2.5 10*3/uL (ref 1.4–7.0)
Neutrophils: 56 %
Platelets: 244 10*3/uL (ref 150–450)
RBC: 5.51 x10E6/uL (ref 4.14–5.80)
RDW: 13 % (ref 11.6–15.4)
WBC: 4.5 10*3/uL (ref 3.4–10.8)

## 2022-12-06 LAB — BRAIN NATRIURETIC PEPTIDE: BNP: 5.9 pg/mL (ref 0.0–100.0)

## 2022-12-06 LAB — CMP14+EGFR
ALT: 20 IU/L (ref 0–44)
AST: 17 IU/L (ref 0–40)
Albumin/Globulin Ratio: 1.8 (ref 1.2–2.2)
Albumin: 4.8 g/dL (ref 4.1–5.1)
Alkaline Phosphatase: 66 IU/L (ref 44–121)
BUN/Creatinine Ratio: 16 (ref 9–20)
BUN: 14 mg/dL (ref 6–24)
Bilirubin Total: 0.6 mg/dL (ref 0.0–1.2)
CO2: 24 mmol/L (ref 20–29)
Calcium: 9.9 mg/dL (ref 8.7–10.2)
Chloride: 100 mmol/L (ref 96–106)
Creatinine, Ser: 0.9 mg/dL (ref 0.76–1.27)
Globulin, Total: 2.6 g/dL (ref 1.5–4.5)
Glucose: 105 mg/dL — ABNORMAL HIGH (ref 70–99)
Potassium: 4.5 mmol/L (ref 3.5–5.2)
Sodium: 139 mmol/L (ref 134–144)
Total Protein: 7.4 g/dL (ref 6.0–8.5)
eGFR: 104 mL/min/{1.73_m2} (ref >59)

## 2022-12-06 LAB — THYROID PANEL WITH TSH
Free Thyroxine Index: 2.6 (ref 1.2–4.9)
T3 Uptake Ratio: 28 % (ref 24–39)
T4, Total: 9.2 ug/dL (ref 4.5–12.0)
TSH: 1.88 u[IU]/mL (ref 0.450–4.500)

## 2022-12-06 LAB — VITAMIN D 25 HYDROXY (VIT D DEFICIENCY, FRACTURES): Vit D, 25-Hydroxy: 44.8 ng/mL (ref 30.0–100.0)

## 2023-01-09 DIAGNOSIS — M9902 Segmental and somatic dysfunction of thoracic region: Secondary | ICD-10-CM | POA: Diagnosis not present

## 2023-01-09 DIAGNOSIS — M9903 Segmental and somatic dysfunction of lumbar region: Secondary | ICD-10-CM | POA: Diagnosis not present

## 2023-01-09 DIAGNOSIS — M62830 Muscle spasm of back: Secondary | ICD-10-CM | POA: Diagnosis not present

## 2023-01-09 DIAGNOSIS — M9904 Segmental and somatic dysfunction of sacral region: Secondary | ICD-10-CM | POA: Diagnosis not present

## 2023-01-13 DIAGNOSIS — M62830 Muscle spasm of back: Secondary | ICD-10-CM | POA: Diagnosis not present

## 2023-01-13 DIAGNOSIS — M9902 Segmental and somatic dysfunction of thoracic region: Secondary | ICD-10-CM | POA: Diagnosis not present

## 2023-01-13 DIAGNOSIS — M9903 Segmental and somatic dysfunction of lumbar region: Secondary | ICD-10-CM | POA: Diagnosis not present

## 2023-01-13 DIAGNOSIS — M9904 Segmental and somatic dysfunction of sacral region: Secondary | ICD-10-CM | POA: Diagnosis not present

## 2023-01-15 DIAGNOSIS — M9903 Segmental and somatic dysfunction of lumbar region: Secondary | ICD-10-CM | POA: Diagnosis not present

## 2023-01-15 DIAGNOSIS — M9904 Segmental and somatic dysfunction of sacral region: Secondary | ICD-10-CM | POA: Diagnosis not present

## 2023-01-15 DIAGNOSIS — M9902 Segmental and somatic dysfunction of thoracic region: Secondary | ICD-10-CM | POA: Diagnosis not present

## 2023-01-15 DIAGNOSIS — M62830 Muscle spasm of back: Secondary | ICD-10-CM | POA: Diagnosis not present

## 2023-01-20 DIAGNOSIS — M9903 Segmental and somatic dysfunction of lumbar region: Secondary | ICD-10-CM | POA: Diagnosis not present

## 2023-01-20 DIAGNOSIS — M9902 Segmental and somatic dysfunction of thoracic region: Secondary | ICD-10-CM | POA: Diagnosis not present

## 2023-01-20 DIAGNOSIS — M9904 Segmental and somatic dysfunction of sacral region: Secondary | ICD-10-CM | POA: Diagnosis not present

## 2023-01-20 DIAGNOSIS — M62830 Muscle spasm of back: Secondary | ICD-10-CM | POA: Diagnosis not present

## 2023-01-22 DIAGNOSIS — M9902 Segmental and somatic dysfunction of thoracic region: Secondary | ICD-10-CM | POA: Diagnosis not present

## 2023-01-22 DIAGNOSIS — M9904 Segmental and somatic dysfunction of sacral region: Secondary | ICD-10-CM | POA: Diagnosis not present

## 2023-01-22 DIAGNOSIS — M9903 Segmental and somatic dysfunction of lumbar region: Secondary | ICD-10-CM | POA: Diagnosis not present

## 2023-01-22 DIAGNOSIS — M62830 Muscle spasm of back: Secondary | ICD-10-CM | POA: Diagnosis not present

## 2023-01-28 DIAGNOSIS — M9902 Segmental and somatic dysfunction of thoracic region: Secondary | ICD-10-CM | POA: Diagnosis not present

## 2023-01-28 DIAGNOSIS — M62830 Muscle spasm of back: Secondary | ICD-10-CM | POA: Diagnosis not present

## 2023-01-28 DIAGNOSIS — M9904 Segmental and somatic dysfunction of sacral region: Secondary | ICD-10-CM | POA: Diagnosis not present

## 2023-01-28 DIAGNOSIS — M9903 Segmental and somatic dysfunction of lumbar region: Secondary | ICD-10-CM | POA: Diagnosis not present

## 2023-01-30 DIAGNOSIS — M9904 Segmental and somatic dysfunction of sacral region: Secondary | ICD-10-CM | POA: Diagnosis not present

## 2023-01-30 DIAGNOSIS — M9902 Segmental and somatic dysfunction of thoracic region: Secondary | ICD-10-CM | POA: Diagnosis not present

## 2023-01-30 DIAGNOSIS — M9903 Segmental and somatic dysfunction of lumbar region: Secondary | ICD-10-CM | POA: Diagnosis not present

## 2023-01-30 DIAGNOSIS — M62830 Muscle spasm of back: Secondary | ICD-10-CM | POA: Diagnosis not present

## 2023-02-03 DIAGNOSIS — M9902 Segmental and somatic dysfunction of thoracic region: Secondary | ICD-10-CM | POA: Diagnosis not present

## 2023-02-03 DIAGNOSIS — M9903 Segmental and somatic dysfunction of lumbar region: Secondary | ICD-10-CM | POA: Diagnosis not present

## 2023-02-03 DIAGNOSIS — M62830 Muscle spasm of back: Secondary | ICD-10-CM | POA: Diagnosis not present

## 2023-02-03 DIAGNOSIS — M9904 Segmental and somatic dysfunction of sacral region: Secondary | ICD-10-CM | POA: Diagnosis not present

## 2023-02-06 DIAGNOSIS — M9902 Segmental and somatic dysfunction of thoracic region: Secondary | ICD-10-CM | POA: Diagnosis not present

## 2023-02-06 DIAGNOSIS — M62830 Muscle spasm of back: Secondary | ICD-10-CM | POA: Diagnosis not present

## 2023-02-06 DIAGNOSIS — M9903 Segmental and somatic dysfunction of lumbar region: Secondary | ICD-10-CM | POA: Diagnosis not present

## 2023-02-06 DIAGNOSIS — M9904 Segmental and somatic dysfunction of sacral region: Secondary | ICD-10-CM | POA: Diagnosis not present

## 2023-02-10 DIAGNOSIS — M62830 Muscle spasm of back: Secondary | ICD-10-CM | POA: Diagnosis not present

## 2023-02-10 DIAGNOSIS — M9904 Segmental and somatic dysfunction of sacral region: Secondary | ICD-10-CM | POA: Diagnosis not present

## 2023-02-10 DIAGNOSIS — M9902 Segmental and somatic dysfunction of thoracic region: Secondary | ICD-10-CM | POA: Diagnosis not present

## 2023-02-10 DIAGNOSIS — M9903 Segmental and somatic dysfunction of lumbar region: Secondary | ICD-10-CM | POA: Diagnosis not present

## 2023-02-12 DIAGNOSIS — M62830 Muscle spasm of back: Secondary | ICD-10-CM | POA: Diagnosis not present

## 2023-02-12 DIAGNOSIS — M9902 Segmental and somatic dysfunction of thoracic region: Secondary | ICD-10-CM | POA: Diagnosis not present

## 2023-02-12 DIAGNOSIS — M9903 Segmental and somatic dysfunction of lumbar region: Secondary | ICD-10-CM | POA: Diagnosis not present

## 2023-02-12 DIAGNOSIS — M9904 Segmental and somatic dysfunction of sacral region: Secondary | ICD-10-CM | POA: Diagnosis not present

## 2023-02-18 DIAGNOSIS — M9904 Segmental and somatic dysfunction of sacral region: Secondary | ICD-10-CM | POA: Diagnosis not present

## 2023-02-18 DIAGNOSIS — M9903 Segmental and somatic dysfunction of lumbar region: Secondary | ICD-10-CM | POA: Diagnosis not present

## 2023-02-18 DIAGNOSIS — M9902 Segmental and somatic dysfunction of thoracic region: Secondary | ICD-10-CM | POA: Diagnosis not present

## 2023-02-18 DIAGNOSIS — M62830 Muscle spasm of back: Secondary | ICD-10-CM | POA: Diagnosis not present

## 2023-02-20 DIAGNOSIS — M62830 Muscle spasm of back: Secondary | ICD-10-CM | POA: Diagnosis not present

## 2023-02-20 DIAGNOSIS — M9904 Segmental and somatic dysfunction of sacral region: Secondary | ICD-10-CM | POA: Diagnosis not present

## 2023-02-20 DIAGNOSIS — M9903 Segmental and somatic dysfunction of lumbar region: Secondary | ICD-10-CM | POA: Diagnosis not present

## 2023-02-20 DIAGNOSIS — M9902 Segmental and somatic dysfunction of thoracic region: Secondary | ICD-10-CM | POA: Diagnosis not present

## 2023-02-24 DIAGNOSIS — M62830 Muscle spasm of back: Secondary | ICD-10-CM | POA: Diagnosis not present

## 2023-02-24 DIAGNOSIS — M9904 Segmental and somatic dysfunction of sacral region: Secondary | ICD-10-CM | POA: Diagnosis not present

## 2023-02-24 DIAGNOSIS — M9903 Segmental and somatic dysfunction of lumbar region: Secondary | ICD-10-CM | POA: Diagnosis not present

## 2023-02-24 DIAGNOSIS — M9902 Segmental and somatic dysfunction of thoracic region: Secondary | ICD-10-CM | POA: Diagnosis not present

## 2023-02-27 DIAGNOSIS — M9904 Segmental and somatic dysfunction of sacral region: Secondary | ICD-10-CM | POA: Diagnosis not present

## 2023-02-27 DIAGNOSIS — M9902 Segmental and somatic dysfunction of thoracic region: Secondary | ICD-10-CM | POA: Diagnosis not present

## 2023-02-27 DIAGNOSIS — M9903 Segmental and somatic dysfunction of lumbar region: Secondary | ICD-10-CM | POA: Diagnosis not present

## 2023-02-27 DIAGNOSIS — M62830 Muscle spasm of back: Secondary | ICD-10-CM | POA: Diagnosis not present

## 2023-03-03 DIAGNOSIS — M9904 Segmental and somatic dysfunction of sacral region: Secondary | ICD-10-CM | POA: Diagnosis not present

## 2023-03-03 DIAGNOSIS — M9902 Segmental and somatic dysfunction of thoracic region: Secondary | ICD-10-CM | POA: Diagnosis not present

## 2023-03-03 DIAGNOSIS — M62830 Muscle spasm of back: Secondary | ICD-10-CM | POA: Diagnosis not present

## 2023-03-03 DIAGNOSIS — M9903 Segmental and somatic dysfunction of lumbar region: Secondary | ICD-10-CM | POA: Diagnosis not present

## 2023-03-04 ENCOUNTER — Encounter: Payer: Self-pay | Admitting: Family Medicine

## 2023-03-04 ENCOUNTER — Ambulatory Visit (INDEPENDENT_AMBULATORY_CARE_PROVIDER_SITE_OTHER): Payer: BC Managed Care – PPO | Admitting: Family Medicine

## 2023-03-04 VITALS — BP 119/75 | HR 70 | Temp 98.0°F | Ht 71.0 in | Wt 170.4 lb

## 2023-03-04 DIAGNOSIS — R7309 Other abnormal glucose: Secondary | ICD-10-CM | POA: Diagnosis not present

## 2023-03-04 DIAGNOSIS — Z125 Encounter for screening for malignant neoplasm of prostate: Secondary | ICD-10-CM

## 2023-03-04 DIAGNOSIS — Z0000 Encounter for general adult medical examination without abnormal findings: Secondary | ICD-10-CM

## 2023-03-04 DIAGNOSIS — Z13220 Encounter for screening for lipoid disorders: Secondary | ICD-10-CM | POA: Diagnosis not present

## 2023-03-04 DIAGNOSIS — Z0001 Encounter for general adult medical examination with abnormal findings: Secondary | ICD-10-CM | POA: Diagnosis not present

## 2023-03-04 DIAGNOSIS — Z1211 Encounter for screening for malignant neoplasm of colon: Secondary | ICD-10-CM

## 2023-03-04 LAB — URINALYSIS
Bilirubin, UA: NEGATIVE
Glucose, UA: NEGATIVE
Ketones, UA: NEGATIVE
Leukocytes,UA: NEGATIVE
Nitrite, UA: NEGATIVE
Protein,UA: NEGATIVE
RBC, UA: NEGATIVE
Specific Gravity, UA: 1.015 (ref 1.005–1.030)
Urobilinogen, Ur: 0.2 mg/dL (ref 0.2–1.0)
pH, UA: 5.5 (ref 5.0–7.5)

## 2023-03-04 LAB — BAYER DCA HB A1C WAIVED: HB A1C (BAYER DCA - WAIVED): 5.6 % (ref 4.8–5.6)

## 2023-03-04 NOTE — Progress Notes (Signed)
Subjective:  Patient ID: Christopher Maxwell, male    DOB: 02-15-1971  Age: 52 y.o. MRN: [id number]  CC: Annual Exam   HPI Christopher Maxwell presents for Annual Exam.      03/04/2023    8:03 AM 12/05/2022    9:01 AM 03/28/2022    9:21 AM  Depression screen PHQ 2/9  Decreased Interest 0 0 0  Down, Depressed, Hopeless 0 0 0  PHQ - 2 Score 0 0 0  Altered sleeping  0   Change in appetite  2   Feeling bad or failure about yourself   0   Trouble concentrating  0   Moving slowly or fidgety/restless  0   Suicidal thoughts  0   PHQ-9 Score  2   Difficult doing work/chores  Not difficult at all     History Christopher Maxwell has a past medical history of Essential hypertension (07/28/2019), Fracture, MVP (mitral valve prolapse), Plantar fasciitis, bilateral, Scoliosis, and Tennis elbow (2022).   He has a past surgical history that includes Wisdom tooth extraction; Vasectomy; Inguinal hernia repair (Right, 04/30/2022); Umbilical hernia repair (N/A, 04/30/2022); and Insertion of mesh (N/A, 04/30/2022).   His family history includes Cancer in his mother; Diabetes in his father.He reports that he quit smoking about 11 years ago. His smoking use included cigarettes. He has never used smokeless tobacco. He reports that he does not currently use alcohol after a past usage of about 7.0 standard drinks of alcohol per week. He reports that he does not use drugs.    ROS Review of Systems  Constitutional:  Negative for activity change, fatigue and unexpected weight change.  HENT:  Negative for congestion, ear pain, hearing loss, postnasal drip and trouble swallowing.        Tenderness at tip pf nose. Hx of facial Rosacea  Eyes:  Negative for pain and visual disturbance.  Respiratory:  Negative for cough, chest tightness and shortness of breath.   Cardiovascular:  Negative for chest pain, palpitations and leg swelling.  Gastrointestinal:  Negative for abdominal distention, abdominal pain, blood in stool,  constipation, diarrhea, nausea and vomiting.  Endocrine: Negative for cold intolerance, heat intolerance and polydipsia.  Genitourinary:  Negative for difficulty urinating, dysuria, flank pain, frequency and urgency.  Musculoskeletal:  Positive for back pain (lower lumbar, midline). Negative for arthralgias and joint swelling.  Skin:  Negative for color change, rash and wound.  Neurological:  Negative for dizziness, syncope, speech difficulty, weakness, light-headedness, numbness and headaches.  Hematological:  Does not bruise/bleed easily.  Psychiatric/Behavioral:  Negative for confusion, decreased concentration, dysphoric mood and sleep disturbance. The patient is not nervous/anxious.     Objective:  BP 119/75   Pulse 70   Temp 98 F (36.7 C)   Ht 5\' 11"  (1.803 m)   Wt 170 lb 6.4 oz (77.3 kg)   SpO2 98%   BMI 23.77 kg/m   BP Readings from Last 3 Encounters:  03/04/23 119/75  12/05/22 135/88  04/30/22 116/83    Wt Readings from Last 3 Encounters:  03/04/23 170 lb 6.4 oz (77.3 kg)  12/05/22 174 lb 6.4 oz (79.1 kg)  04/30/22 174 lb (78.9 kg)     Physical Exam Constitutional:      Appearance: He is well-developed.  HENT:     Head: Normocephalic and atraumatic.  Eyes:     Pupils: Pupils are equal, round, and reactive to light.  Neck:     Thyroid: No thyromegaly.     Trachea:  No tracheal deviation.  Cardiovascular:     Rate and Rhythm: Normal rate and regular rhythm.     Heart sounds: Normal heart sounds. No murmur heard.    No friction rub. No gallop.  Pulmonary:     Breath sounds: Normal breath sounds. No wheezing or rales.  Abdominal:     General: Bowel sounds are normal. There is no distension.     Palpations: Abdomen is soft. There is no mass.     Tenderness: There is no abdominal tenderness.     Hernia: There is no hernia in the left inguinal area.  Genitourinary:    Penis: Normal.      Testes: Normal.  Musculoskeletal:        General: Normal range of  motion.     Cervical back: Normal range of motion.  Lymphadenopathy:     Cervical: No cervical adenopathy.  Skin:    General: Skin is warm and dry.  Neurological:     Mental Status: He is alert and oriented to person, place, and time.       Assessment & Plan:   [name] was seen today for annual exam.  Diagnoses and all orders for this visit:  Well adult exam -     CBC with Differential/Platelet -     CMP14+EGFR -     Lipid panel -     PSA, total and free -     Urinalysis  Lipid screening -     Lipid panel -     Bayer DCA Hb A1c Waived  Screening for prostate cancer -     PSA, total and free  Screen for colon cancer -     Ambulatory referral to Gastroenterology  Elevated glucose level -     Bayer DCA Hb A1c Waived       I have discontinued Christiane Ha B. Twiggs's Visine, Preparation H, polyethylene glycol, Chlorphen-PE-Acetaminophen, and ondansetron. I am also having him maintain his Vitamin D, ibuprofen, fluticasone, and cetirizine.  Allergies as of 03/04/2023       Reactions   Azithromycin Other (See Comments)   Hypothermia, diaphoresis, weakness   Celebrex [celecoxib]    Flank pain   Penicillin G Shortness Of Breath   Penicillins Shortness Of Breath        Medication List        Accurate as of March 04, 2023  9:08 AM. If you have any questions, ask your nurse or doctor.          STOP taking these medications    Chlorphen-PE-Acetaminophen 4-10-325 MG Tabs Stopped by: Mccrae Speciale   ondansetron 4 MG tablet Commonly known as: Zofran Stopped by: Analayah Brooke   polyethylene glycol 17 g packet Commonly known as: MIRALAX / GLYCOLAX Stopped by: Broadus John Amanpreet Delmont   Preparation H 0.25-14-74.9 % rectal ointment Generic drug: phenylephrine-shark liver oil-mineral oil-petrolatum Stopped by: Jaskirat Schwieger   Visine 0.025-0.3 % ophthalmic solution Generic drug: naphazoline-pheniramine Stopped by: Kieu Quiggle       TAKE these medications     cetirizine 10 MG chewable tablet Commonly known as: ZYRTEC Chew 10 mg by mouth daily. What changed: Another medication with the same name was removed. Continue taking this medication, and follow the directions you see here. Changed by: Talin Feister   fluticasone 50 MCG/ACT nasal spray Commonly known as: FLONASE Place 1 spray into both nostrils daily as needed for rhinitis.   ibuprofen 200 MG tablet Commonly known as: ADVIL Take 400 mg by mouth every  6 (six) hours as needed for mild pain or moderate pain.   Vitamin D 50 MCG (2000 UT) Caps Take 2,000 Units by mouth daily.         Follow-up: Return in about 1 year (around 03/03/2024), or if symptoms worsen or fail to improve.  Mechele Claude, M.D.

## 2023-03-05 LAB — CBC WITH DIFFERENTIAL/PLATELET
Basophils Absolute: 0.1 10*3/uL (ref 0.0–0.2)
Basos: 1 %
EOS (ABSOLUTE): 0.2 10*3/uL (ref 0.0–0.4)
Eos: 3 %
Hematocrit: 49.8 % (ref 37.5–51.0)
Hemoglobin: 16.6 g/dL (ref 13.0–17.7)
Immature Grans (Abs): 0 10*3/uL (ref 0.0–0.1)
Immature Granulocytes: 0 %
Lymphocytes Absolute: 1.6 10*3/uL (ref 0.7–3.1)
Lymphs: 24 %
MCH: 28.5 pg (ref 26.6–33.0)
MCHC: 33.3 g/dL (ref 31.5–35.7)
MCV: 86 fL (ref 79–97)
Monocytes Absolute: 0.4 10*3/uL (ref 0.1–0.9)
Monocytes: 7 %
Neutrophils Absolute: 4.3 10*3/uL (ref 1.4–7.0)
Neutrophils: 65 %
Platelets: 267 10*3/uL (ref 150–450)
RBC: 5.82 x10E6/uL — ABNORMAL HIGH (ref 4.14–5.80)
RDW: 13.2 % (ref 11.6–15.4)
WBC: 6.6 10*3/uL (ref 3.4–10.8)

## 2023-03-05 LAB — CMP14+EGFR
ALT: 17 IU/L (ref 0–44)
AST: 15 IU/L (ref 0–40)
Albumin: 4.7 g/dL (ref 4.1–5.1)
Alkaline Phosphatase: 67 IU/L (ref 44–121)
BUN/Creatinine Ratio: 14 (ref 9–20)
BUN: 13 mg/dL (ref 6–24)
Bilirubin Total: 0.7 mg/dL (ref 0.0–1.2)
CO2: 27 mmol/L (ref 20–29)
Calcium: 9.8 mg/dL (ref 8.7–10.2)
Chloride: 96 mmol/L (ref 96–106)
Creatinine, Ser: 0.95 mg/dL (ref 0.76–1.27)
Globulin, Total: 2.6 g/dL (ref 1.5–4.5)
Glucose: 115 mg/dL — ABNORMAL HIGH (ref 70–99)
Potassium: 5 mmol/L (ref 3.5–5.2)
Sodium: 136 mmol/L (ref 134–144)
Total Protein: 7.3 g/dL (ref 6.0–8.5)
eGFR: 98 mL/min/{1.73_m2} (ref >59)

## 2023-03-05 LAB — LIPID PANEL
Chol/HDL Ratio: 2.5 ratio (ref 0.0–5.0)
Cholesterol, Total: 184 mg/dL (ref 100–199)
HDL: 73 mg/dL (ref >39)
LDL Chol Calc (NIH): 94 mg/dL (ref 0–99)
Triglycerides: 92 mg/dL (ref 0–149)
VLDL Cholesterol Cal: 17 mg/dL (ref 5–40)

## 2023-03-05 LAB — PSA, TOTAL AND FREE
PSA, Free Pct: 51 %
PSA, Free: 0.51 ng/mL
Prostate Specific Ag, Serum: 1 ng/mL (ref 0.0–4.0)

## 2023-03-06 DIAGNOSIS — M9904 Segmental and somatic dysfunction of sacral region: Secondary | ICD-10-CM | POA: Diagnosis not present

## 2023-03-06 DIAGNOSIS — M9903 Segmental and somatic dysfunction of lumbar region: Secondary | ICD-10-CM | POA: Diagnosis not present

## 2023-03-06 DIAGNOSIS — M9902 Segmental and somatic dysfunction of thoracic region: Secondary | ICD-10-CM | POA: Diagnosis not present

## 2023-03-06 DIAGNOSIS — M62830 Muscle spasm of back: Secondary | ICD-10-CM | POA: Diagnosis not present

## 2023-03-13 DIAGNOSIS — M9903 Segmental and somatic dysfunction of lumbar region: Secondary | ICD-10-CM | POA: Diagnosis not present

## 2023-03-13 DIAGNOSIS — M62830 Muscle spasm of back: Secondary | ICD-10-CM | POA: Diagnosis not present

## 2023-03-13 DIAGNOSIS — M9904 Segmental and somatic dysfunction of sacral region: Secondary | ICD-10-CM | POA: Diagnosis not present

## 2023-03-13 DIAGNOSIS — M9902 Segmental and somatic dysfunction of thoracic region: Secondary | ICD-10-CM | POA: Diagnosis not present

## 2023-03-20 DIAGNOSIS — M9902 Segmental and somatic dysfunction of thoracic region: Secondary | ICD-10-CM | POA: Diagnosis not present

## 2023-03-20 DIAGNOSIS — M62830 Muscle spasm of back: Secondary | ICD-10-CM | POA: Diagnosis not present

## 2023-03-20 DIAGNOSIS — M9904 Segmental and somatic dysfunction of sacral region: Secondary | ICD-10-CM | POA: Diagnosis not present

## 2023-03-20 DIAGNOSIS — M9903 Segmental and somatic dysfunction of lumbar region: Secondary | ICD-10-CM | POA: Diagnosis not present

## 2023-03-26 DIAGNOSIS — M62830 Muscle spasm of back: Secondary | ICD-10-CM | POA: Diagnosis not present

## 2023-03-26 DIAGNOSIS — M9904 Segmental and somatic dysfunction of sacral region: Secondary | ICD-10-CM | POA: Diagnosis not present

## 2023-03-26 DIAGNOSIS — M9902 Segmental and somatic dysfunction of thoracic region: Secondary | ICD-10-CM | POA: Diagnosis not present

## 2023-03-26 DIAGNOSIS — M9903 Segmental and somatic dysfunction of lumbar region: Secondary | ICD-10-CM | POA: Diagnosis not present

## 2023-03-31 DIAGNOSIS — M9902 Segmental and somatic dysfunction of thoracic region: Secondary | ICD-10-CM | POA: Diagnosis not present

## 2023-03-31 DIAGNOSIS — M9903 Segmental and somatic dysfunction of lumbar region: Secondary | ICD-10-CM | POA: Diagnosis not present

## 2023-03-31 DIAGNOSIS — M9904 Segmental and somatic dysfunction of sacral region: Secondary | ICD-10-CM | POA: Diagnosis not present

## 2023-03-31 DIAGNOSIS — M62830 Muscle spasm of back: Secondary | ICD-10-CM | POA: Diagnosis not present

## 2023-04-02 DIAGNOSIS — M9902 Segmental and somatic dysfunction of thoracic region: Secondary | ICD-10-CM | POA: Diagnosis not present

## 2023-04-02 DIAGNOSIS — M62830 Muscle spasm of back: Secondary | ICD-10-CM | POA: Diagnosis not present

## 2023-04-02 DIAGNOSIS — M9903 Segmental and somatic dysfunction of lumbar region: Secondary | ICD-10-CM | POA: Diagnosis not present

## 2023-04-02 DIAGNOSIS — M9904 Segmental and somatic dysfunction of sacral region: Secondary | ICD-10-CM | POA: Diagnosis not present

## 2023-04-10 DIAGNOSIS — M9904 Segmental and somatic dysfunction of sacral region: Secondary | ICD-10-CM | POA: Diagnosis not present

## 2023-04-10 DIAGNOSIS — M9902 Segmental and somatic dysfunction of thoracic region: Secondary | ICD-10-CM | POA: Diagnosis not present

## 2023-04-10 DIAGNOSIS — M9903 Segmental and somatic dysfunction of lumbar region: Secondary | ICD-10-CM | POA: Diagnosis not present

## 2023-04-10 DIAGNOSIS — M62830 Muscle spasm of back: Secondary | ICD-10-CM | POA: Diagnosis not present

## 2023-04-14 DIAGNOSIS — M62830 Muscle spasm of back: Secondary | ICD-10-CM | POA: Diagnosis not present

## 2023-04-14 DIAGNOSIS — M9903 Segmental and somatic dysfunction of lumbar region: Secondary | ICD-10-CM | POA: Diagnosis not present

## 2023-04-14 DIAGNOSIS — M9904 Segmental and somatic dysfunction of sacral region: Secondary | ICD-10-CM | POA: Diagnosis not present

## 2023-04-14 DIAGNOSIS — M9902 Segmental and somatic dysfunction of thoracic region: Secondary | ICD-10-CM | POA: Diagnosis not present

## 2023-04-17 DIAGNOSIS — M9904 Segmental and somatic dysfunction of sacral region: Secondary | ICD-10-CM | POA: Diagnosis not present

## 2023-04-17 DIAGNOSIS — M9903 Segmental and somatic dysfunction of lumbar region: Secondary | ICD-10-CM | POA: Diagnosis not present

## 2023-04-17 DIAGNOSIS — M62830 Muscle spasm of back: Secondary | ICD-10-CM | POA: Diagnosis not present

## 2023-04-17 DIAGNOSIS — M9902 Segmental and somatic dysfunction of thoracic region: Secondary | ICD-10-CM | POA: Diagnosis not present

## 2023-04-21 DIAGNOSIS — M9902 Segmental and somatic dysfunction of thoracic region: Secondary | ICD-10-CM | POA: Diagnosis not present

## 2023-04-21 DIAGNOSIS — M62830 Muscle spasm of back: Secondary | ICD-10-CM | POA: Diagnosis not present

## 2023-04-21 DIAGNOSIS — M9904 Segmental and somatic dysfunction of sacral region: Secondary | ICD-10-CM | POA: Diagnosis not present

## 2023-04-21 DIAGNOSIS — M9903 Segmental and somatic dysfunction of lumbar region: Secondary | ICD-10-CM | POA: Diagnosis not present

## 2023-04-23 DIAGNOSIS — K644 Residual hemorrhoidal skin tags: Secondary | ICD-10-CM | POA: Diagnosis not present

## 2023-04-23 DIAGNOSIS — Z1211 Encounter for screening for malignant neoplasm of colon: Secondary | ICD-10-CM | POA: Diagnosis not present

## 2023-04-23 DIAGNOSIS — K648 Other hemorrhoids: Secondary | ICD-10-CM | POA: Diagnosis not present

## 2023-04-23 DIAGNOSIS — K5730 Diverticulosis of large intestine without perforation or abscess without bleeding: Secondary | ICD-10-CM | POA: Diagnosis not present

## 2023-04-23 LAB — HM COLONOSCOPY

## 2023-04-28 DIAGNOSIS — M9902 Segmental and somatic dysfunction of thoracic region: Secondary | ICD-10-CM | POA: Diagnosis not present

## 2023-04-28 DIAGNOSIS — M9904 Segmental and somatic dysfunction of sacral region: Secondary | ICD-10-CM | POA: Diagnosis not present

## 2023-04-28 DIAGNOSIS — M9903 Segmental and somatic dysfunction of lumbar region: Secondary | ICD-10-CM | POA: Diagnosis not present

## 2023-04-28 DIAGNOSIS — M62830 Muscle spasm of back: Secondary | ICD-10-CM | POA: Diagnosis not present

## 2023-05-12 DIAGNOSIS — Z23 Encounter for immunization: Secondary | ICD-10-CM | POA: Diagnosis not present

## 2023-05-15 DIAGNOSIS — M9904 Segmental and somatic dysfunction of sacral region: Secondary | ICD-10-CM | POA: Diagnosis not present

## 2023-05-15 DIAGNOSIS — M62830 Muscle spasm of back: Secondary | ICD-10-CM | POA: Diagnosis not present

## 2023-05-15 DIAGNOSIS — M9902 Segmental and somatic dysfunction of thoracic region: Secondary | ICD-10-CM | POA: Diagnosis not present

## 2023-05-15 DIAGNOSIS — M9903 Segmental and somatic dysfunction of lumbar region: Secondary | ICD-10-CM | POA: Diagnosis not present

## 2023-05-21 DIAGNOSIS — M9904 Segmental and somatic dysfunction of sacral region: Secondary | ICD-10-CM | POA: Diagnosis not present

## 2023-05-21 DIAGNOSIS — M9903 Segmental and somatic dysfunction of lumbar region: Secondary | ICD-10-CM | POA: Diagnosis not present

## 2023-05-21 DIAGNOSIS — M9902 Segmental and somatic dysfunction of thoracic region: Secondary | ICD-10-CM | POA: Diagnosis not present

## 2023-05-21 DIAGNOSIS — M62830 Muscle spasm of back: Secondary | ICD-10-CM | POA: Diagnosis not present

## 2023-05-29 DIAGNOSIS — M62830 Muscle spasm of back: Secondary | ICD-10-CM | POA: Diagnosis not present

## 2023-05-29 DIAGNOSIS — M9904 Segmental and somatic dysfunction of sacral region: Secondary | ICD-10-CM | POA: Diagnosis not present

## 2023-05-29 DIAGNOSIS — M9902 Segmental and somatic dysfunction of thoracic region: Secondary | ICD-10-CM | POA: Diagnosis not present

## 2023-05-29 DIAGNOSIS — M9903 Segmental and somatic dysfunction of lumbar region: Secondary | ICD-10-CM | POA: Diagnosis not present

## 2023-06-04 DIAGNOSIS — M62830 Muscle spasm of back: Secondary | ICD-10-CM | POA: Diagnosis not present

## 2023-06-04 DIAGNOSIS — M9902 Segmental and somatic dysfunction of thoracic region: Secondary | ICD-10-CM | POA: Diagnosis not present

## 2023-06-04 DIAGNOSIS — M9904 Segmental and somatic dysfunction of sacral region: Secondary | ICD-10-CM | POA: Diagnosis not present

## 2023-06-04 DIAGNOSIS — M9903 Segmental and somatic dysfunction of lumbar region: Secondary | ICD-10-CM | POA: Diagnosis not present

## 2023-07-23 DIAGNOSIS — M9904 Segmental and somatic dysfunction of sacral region: Secondary | ICD-10-CM | POA: Diagnosis not present

## 2023-07-23 DIAGNOSIS — M9902 Segmental and somatic dysfunction of thoracic region: Secondary | ICD-10-CM | POA: Diagnosis not present

## 2023-07-23 DIAGNOSIS — M62830 Muscle spasm of back: Secondary | ICD-10-CM | POA: Diagnosis not present

## 2023-07-23 DIAGNOSIS — M9903 Segmental and somatic dysfunction of lumbar region: Secondary | ICD-10-CM | POA: Diagnosis not present

## 2023-07-30 DIAGNOSIS — M9903 Segmental and somatic dysfunction of lumbar region: Secondary | ICD-10-CM | POA: Diagnosis not present

## 2023-07-30 DIAGNOSIS — M9904 Segmental and somatic dysfunction of sacral region: Secondary | ICD-10-CM | POA: Diagnosis not present

## 2023-07-30 DIAGNOSIS — M62830 Muscle spasm of back: Secondary | ICD-10-CM | POA: Diagnosis not present

## 2023-07-30 DIAGNOSIS — M9902 Segmental and somatic dysfunction of thoracic region: Secondary | ICD-10-CM | POA: Diagnosis not present

## 2023-08-06 DIAGNOSIS — M62830 Muscle spasm of back: Secondary | ICD-10-CM | POA: Diagnosis not present

## 2023-08-06 DIAGNOSIS — M9904 Segmental and somatic dysfunction of sacral region: Secondary | ICD-10-CM | POA: Diagnosis not present

## 2023-08-06 DIAGNOSIS — M9902 Segmental and somatic dysfunction of thoracic region: Secondary | ICD-10-CM | POA: Diagnosis not present

## 2023-08-06 DIAGNOSIS — M9903 Segmental and somatic dysfunction of lumbar region: Secondary | ICD-10-CM | POA: Diagnosis not present

## 2023-08-13 DIAGNOSIS — M9902 Segmental and somatic dysfunction of thoracic region: Secondary | ICD-10-CM | POA: Diagnosis not present

## 2023-08-13 DIAGNOSIS — M9904 Segmental and somatic dysfunction of sacral region: Secondary | ICD-10-CM | POA: Diagnosis not present

## 2023-08-13 DIAGNOSIS — M62830 Muscle spasm of back: Secondary | ICD-10-CM | POA: Diagnosis not present

## 2023-08-13 DIAGNOSIS — M9903 Segmental and somatic dysfunction of lumbar region: Secondary | ICD-10-CM | POA: Diagnosis not present

## 2023-08-19 ENCOUNTER — Ambulatory Visit: Payer: BC Managed Care – PPO | Admitting: Family Medicine

## 2023-08-19 VITALS — BP 116/70 | HR 106 | Temp 97.9°F | Ht 71.0 in | Wt 158.2 lb

## 2023-08-19 DIAGNOSIS — R0981 Nasal congestion: Secondary | ICD-10-CM | POA: Diagnosis not present

## 2023-08-19 DIAGNOSIS — R051 Acute cough: Secondary | ICD-10-CM | POA: Diagnosis not present

## 2023-08-19 DIAGNOSIS — J029 Acute pharyngitis, unspecified: Secondary | ICD-10-CM

## 2023-08-19 DIAGNOSIS — J069 Acute upper respiratory infection, unspecified: Secondary | ICD-10-CM

## 2023-08-19 LAB — VERITOR FLU A/B WAIVED
Influenza A: NEGATIVE
Influenza B: NEGATIVE

## 2023-08-19 MED ORDER — CHLORPHEN-PE-ACETAMINOPHEN 4-10-325 MG PO TABS: 1.0000 | ORAL_TABLET | Freq: Four times a day (QID) | ORAL | 0 refills | Status: DC | PRN

## 2023-08-19 NOTE — Progress Notes (Signed)
 Subjective:  Patient ID: Christopher Maxwell, male    DOB: [birth year], 53 y.o.   MRN: [id number]  Patient Care Team: Zollie Lowers, MD as PCP - General (Family Medicine)   Chief Complaint:  Sore Throat (Symptoms started Sunday night), Nasal Congestion, and Cough   HPI: Christopher Maxwell is a 53 y.o. male presenting on 08/19/2023 for Sore Throat (Symptoms started Sunday night), Nasal Congestion, and Cough   Discussed the use of AI scribe software for clinical note transcription with the patient, who gave verbal consent to proceed.  History of Present Illness   Christopher Maxwell is a 53 year old male who presents with symptoms of an upper respiratory infection.  Symptoms began on Sunday after slipping on the couch and have progressively worsened. He feels generally unwell but denies fever. He has a runny nose and a cough.  He notes increased frequency of bowel movements, going to the bathroom about four times a day, but denies diarrhea, stating 'I'm lucky if I go once a day.'  He mentions visiting a museum on Saturday and being around many people, which he associates with the onset of his symptoms.  He has been taking Tylenol , which has helped with the aching. He has not checked his temperature but was informed by the nurse that it was not elevated.          Relevant past medical, surgical, family, and social history reviewed and updated as indicated.  Allergies and medications reviewed and updated. Data reviewed: Chart in Epic.   Past Medical History:  Diagnosis Date   Essential hypertension 07/28/2019   Fracture    R posterior ribs x 4   MVP (mitral valve prolapse)    Had as a child, no longer present   Plantar fasciitis, bilateral    Scoliosis    Tennis elbow 2022    Past Surgical History:  Procedure Laterality Date   INGUINAL HERNIA REPAIR Right 04/30/2022   Procedure: OPEN RIGHT INGUINAL HERNIA REPAIR;  Surgeon: Sebastian Moles, MD;  Location: [facility] OR;   Service: General;  Laterality: Right;   INSERTION OF MESH N/A 04/30/2022   Procedure: INSERTION OF MESH;  Surgeon: Sebastian Moles, MD;  Location: [facility] OR;  Service: General;  Laterality: N/A;  umbilical and left inguinal   UMBILICAL HERNIA REPAIR N/A 04/30/2022   Procedure: OPEN UMBILICAL HERNIA REPAIR;  Surgeon: Sebastian Moles, MD;  Location: [facility] OR;  Service: General;  Laterality: N/A;   VASECTOMY     WISDOM TOOTH EXTRACTION      Social History   Socioeconomic History   Marital status: Married    Spouse name: Peggy   Number of children: 1   Years of education: Not on file   Highest education level: Some college, no degree  Occupational History   Not on file  Tobacco Use   Smoking status: Former    Current packs/day: 0.00    Types: Cigarettes    Quit date: 04/15/2011    Years since quitting: 12.3   Smokeless tobacco: Never  Vaping Use   Vaping status: Never Used  Substance and Sexual Activity   Alcohol use: Not Currently    Alcohol/week: 7.0 standard drinks of alcohol    Types: 7 Glasses of wine per week    Comment: SOCIAL   Drug use: No   Sexual activity: Yes  Other Topics Concern   Not on file  Social History Narrative   Daughter Chiquita   Social Drivers of  Health   Financial Resource Strain: Low Risk  (08/19/2023)   Overall Financial Resource Strain (CARDIA)    Difficulty of Paying Living Expenses: Not hard at all  Food Insecurity: No Food Insecurity (08/19/2023)   Hunger Vital Sign    Worried About Running Out of Food in the Last Year: Never true    Ran Out of Food in the Last Year: Never true  Transportation Needs: No Transportation Needs (08/19/2023)   PRAPARE - Administrator, Civil Service (Medical): No    Lack of Transportation (Non-Medical): No  Physical Activity: Sufficiently Active (08/19/2023)   Exercise Vital Sign    Days of Exercise per Week: 5 days    Minutes of Exercise per Session: 30 min  Stress: No Stress Concern Present (08/19/2023)    [employer] of Occupational Health - Occupational Stress Questionnaire    Feeling of Stress : Not at all  Social Connections: Unknown (08/19/2023)   Social Connection and Isolation Panel [NHANES]    Frequency of Communication with Friends and Family: More than three times a week    Frequency of Social Gatherings with Friends and Family: Once a week    Attends Religious Services: Patient declined    Database Administrator or Organizations: No    Attends Engineer, Structural: Not on file    Marital Status: Married  Intimate Partner Violence: Unknown (03/14/2023)   Received from [facility]   HITS    Physically Hurt: Not on file    Insult or Talk Down To: Not on file    Threaten Physical Harm: Not on file    Scream or Curse: Not on file    Outpatient Encounter Medications as of 08/19/2023  Medication Sig   cetirizine (ZYRTEC) 10 MG chewable tablet Chew 10 mg by mouth daily.   Chlorphen-PE-Acetaminophen  4-10-325 MG TABS Take 1 tablet by mouth every 6 (six) hours as needed.   Cholecalciferol (VITAMIN D ) 50 MCG (2000 UT) CAPS Take 2,000 Units by mouth daily.   fluticasone  (FLONASE ) 50 MCG/ACT nasal spray Place 1 spray into both nostrils daily as needed for rhinitis. (Patient not taking: Reported on 08/19/2023)   ibuprofen (ADVIL) 200 MG tablet Take 400 mg by mouth every 6 (six) hours as needed for mild pain or moderate pain. (Patient not taking: Reported on 08/19/2023)   No facility-administered encounter medications on file as of 08/19/2023.    Allergies  Allergen Reactions   Azithromycin  Other (See Comments)    Hypothermia, diaphoresis, weakness   Celebrex  [Celecoxib ]     Flank pain   Penicillin G Shortness Of Breath   Penicillins Shortness Of Breath    Pertinent ROS per HPI, otherwise unremarkable      Objective:  BP 116/70   Pulse (!) 106   Temp 97.9 F (36.6 C) (Temporal)   Ht 5' 11 (1.803 m)   Wt 158 lb 3.2 oz (71.8 kg)   SpO2 97%   BMI 22.06 kg/m    Wt  Readings from Last 3 Encounters:  08/19/23 158 lb 3.2 oz (71.8 kg)  03/04/23 170 lb 6.4 oz (77.3 kg)  12/05/22 174 lb 6.4 oz (79.1 kg)    Physical Exam Vitals and nursing note reviewed.  Constitutional:      General: He is not in acute distress.    Appearance: Normal appearance. He is normal weight. He is not ill-appearing, toxic-appearing or diaphoretic.  HENT:     Head: Normocephalic and atraumatic.     Right  Ear: A middle ear effusion is present. Tympanic membrane is not erythematous.     Left Ear: A middle ear effusion is present. Tympanic membrane is not erythematous.     Nose: Congestion present.     Right Sinus: No maxillary sinus tenderness or frontal sinus tenderness.     Left Sinus: No maxillary sinus tenderness or frontal sinus tenderness.     Mouth/Throat:     Lips: Pink.     Mouth: Mucous membranes are moist.     Pharynx: Posterior oropharyngeal erythema and postnasal drip present. No pharyngeal swelling, oropharyngeal exudate or uvula swelling.     Tonsils: No tonsillar exudate or tonsillar abscesses.  Eyes:     Conjunctiva/sclera: Conjunctivae normal.     Pupils: Pupils are equal, round, and reactive to light.  Cardiovascular:     Rate and Rhythm: Normal rate and regular rhythm.     Heart sounds: Normal heart sounds.  Pulmonary:     Effort: Pulmonary effort is normal.     Breath sounds: Normal breath sounds.  Musculoskeletal:     Comments: Scoliosis  Skin:    General: Skin is warm and dry.     Capillary Refill: Capillary refill takes less than 2 seconds.  Neurological:     General: No focal deficit present.     Mental Status: He is alert and oriented to person, place, and time.  Psychiatric:        Mood and Affect: Mood normal.        Behavior: Behavior normal. Behavior is cooperative.        Thought Content: Thought content normal.        Judgment: Judgment normal.    Physical Exam   HEENT: Mild drainage in posterior pharynx. No exudate or significant  inflammation. CHEST: Lungs clear to auscultation bilaterally.        Results for orders placed or performed in visit on 07/02/23  HM COLONOSCOPY   Collection Time: 04/23/23 12:04 PM  Result Value Ref Range   HM Colonoscopy See Report (in chart) See Report (in chart), Patient Reported       Pertinent labs & imaging results that were available during my care of the patient were reviewed by me and considered in my medical decision making.  Assessment & Plan:  Shariq Puig was seen today for sore throat, nasal congestion and cough.  Diagnoses and all orders for this visit:  Acute cough -     Veritor Flu A/B Waived -     Chlorphen-PE-Acetaminophen  4-10-325 MG TABS; Take 1 tablet by mouth every 6 (six) hours as needed.  Sore throat -     Veritor Flu A/B Waived -     Chlorphen-PE-Acetaminophen  4-10-325 MG TABS; Take 1 tablet by mouth every 6 (six) hours as needed.  Nasal congestion -     Veritor Flu A/B Waived -     Chlorphen-PE-Acetaminophen  4-10-325 MG TABS; Take 1 tablet by mouth every 6 (six) hours as needed.  URI with cough and congestion -     Chlorphen-PE-Acetaminophen  4-10-325 MG TABS; Take 1 tablet by mouth every 6 (six) hours as needed.     Assessment and Plan    Viral Upper Respiratory Infection   Presents with symptoms of a viral upper respiratory infection, including nasal drainage, cough, and increased bowel movements without diarrhea. Flu swab was negative. Symptoms are consistent with a common cold, prevalent during this time of year. Managing symptoms with Tylenol , which has helped with the aching. Discussed  that Neoral, a combination of Tylenol , decongestant, and cough suppressant, will help with symptom management. Emphasized the importance of hydration, rest, and monitoring for significant fevers, which may necessitate further evaluation.   - Prescribe Neoral (Tylenol , decongestant, and cough suppressant) in pill form   - Advise to stay hydrated   -  Instruct to monitor for significant fevers and report if he occurs   - Recommend rest and continue taking prescribed medications.          Continue all other maintenance medications.  Follow up plan: Return if symptoms worsen or fail to improve.   Continue healthy lifestyle choices, including diet (rich in fruits, vegetables, and lean proteins, and low in salt and simple carbohydrates) and exercise (at least 30 minutes of moderate physical activity daily).    The above assessment and management plan was discussed with the patient. The patient verbalized understanding of and has agreed to the management plan. Patient is aware to call the clinic if they develop any new symptoms or if symptoms persist or worsen. Patient is aware when to return to the clinic for a follow-up visit. Patient educated on when it is appropriate to go to the emergency department.   Rosaline Bruns, FNP-C [facility] [phone]

## 2024-03-04 ENCOUNTER — Ambulatory Visit (INDEPENDENT_AMBULATORY_CARE_PROVIDER_SITE_OTHER): Payer: BC Managed Care – PPO | Admitting: Family Medicine

## 2024-03-04 ENCOUNTER — Encounter: Payer: Self-pay | Admitting: Family Medicine

## 2024-03-04 VITALS — BP 120/77 | HR 75 | Temp 98.0°F | Ht 71.0 in | Wt 172.2 lb

## 2024-03-04 DIAGNOSIS — Z13220 Encounter for screening for lipoid disorders: Secondary | ICD-10-CM

## 2024-03-04 DIAGNOSIS — Z0000 Encounter for general adult medical examination without abnormal findings: Secondary | ICD-10-CM

## 2024-03-04 DIAGNOSIS — M4125 Other idiopathic scoliosis, thoracolumbar region: Secondary | ICD-10-CM

## 2024-03-04 DIAGNOSIS — Z0001 Encounter for general adult medical examination with abnormal findings: Secondary | ICD-10-CM | POA: Diagnosis not present

## 2024-03-04 DIAGNOSIS — Z125 Encounter for screening for malignant neoplasm of prostate: Secondary | ICD-10-CM

## 2024-03-04 DIAGNOSIS — R7309 Other abnormal glucose: Secondary | ICD-10-CM

## 2024-03-04 LAB — URINALYSIS
Bilirubin, UA: NEGATIVE
Glucose, UA: NEGATIVE
Ketones, UA: NEGATIVE
Leukocytes,UA: NEGATIVE
Nitrite, UA: NEGATIVE
Protein,UA: NEGATIVE
RBC, UA: NEGATIVE
Specific Gravity, UA: 1.025 (ref 1.005–1.030)
Urobilinogen, Ur: 0.2 mg/dL (ref 0.2–1.0)
pH, UA: 6 (ref 5.0–7.5)

## 2024-03-04 LAB — BAYER DCA HB A1C WAIVED: HB A1C (BAYER DCA - WAIVED): 5.5 % (ref 4.8–5.6)

## 2024-03-04 LAB — LIPID PANEL

## 2024-03-04 MED ORDER — NABUMETONE 500 MG PO TABS: 1000.0000 mg | ORAL_TABLET | Freq: Two times a day (BID) | ORAL | 1 refills | Status: DC

## 2024-03-04 MED ORDER — NABUMETONE 500 MG PO TABS: 1000.0000 mg | ORAL_TABLET | Freq: Two times a day (BID) | ORAL | 1 refills | Status: AC

## 2024-03-04 NOTE — Progress Notes (Signed)
 Subjective:  Patient ID: Christopher Maxwell, male    DOB: August 09, 1970  Age: 53 y.o. MRN: [id number]  CC: Annual Exam   HPI  Here for annual physical exam.  History of Present Illness           03/04/2024    7:57 AM 03/04/2023    8:03 AM 12/05/2022    9:01 AM  Depression screen PHQ 2/9  Decreased Interest 0 0 0  Down, Depressed, Hopeless 0 0 0  PHQ - 2 Score 0 0 0  Altered sleeping   0  Change in appetite   2  Feeling bad or failure about yourself    0  Trouble concentrating   0  Moving slowly or fidgety/restless   0  Suicidal thoughts   0  PHQ-9 Score   2  Difficult doing work/chores   Not difficult at all    History Vallie has a past medical history of Essential hypertension (07/28/2019), Fracture, MVP (mitral valve prolapse), Plantar fasciitis, bilateral, Scoliosis, and Tennis elbow (2022).   He has a past surgical history that includes Wisdom tooth extraction; Vasectomy; Inguinal hernia repair (Right, 04/30/2022); Umbilical hernia repair (N/A, 04/30/2022); and Insertion of mesh (N/A, 04/30/2022).   His family history includes Cancer in his mother; Diabetes in his father.He reports that he quit smoking about 12 years ago. His smoking use included cigarettes. He has never used smokeless tobacco. He reports that he does not currently use alcohol after a past usage of about 7.0 standard drinks of alcohol per week. He reports that he does not use drugs.    ROS Review of Systems  Constitutional:  Negative for activity change, fatigue and unexpected weight change.  HENT:  Negative for congestion, ear pain, hearing loss, postnasal drip and trouble swallowing.   Eyes:  Negative for pain and visual disturbance.  Respiratory:  Negative for cough, chest tightness and shortness of breath.   Cardiovascular:  Negative for chest pain, palpitations and leg swelling.  Gastrointestinal:  Negative for abdominal distention, abdominal pain, blood in stool, constipation, diarrhea,  nausea and vomiting.  Endocrine: Negative for cold intolerance, heat intolerance and polydipsia.  Genitourinary:  Negative for difficulty urinating, dysuria, flank pain, frequency and urgency.  Musculoskeletal:  Positive for arthralgias (left knee pain, medial joint line). Negative for joint swelling.  Skin:  Negative for color change, rash and wound.  Neurological:  Negative for dizziness, syncope, speech difficulty, weakness, light-headedness, numbness and headaches.  Hematological:  Does not bruise/bleed easily.  Psychiatric/Behavioral:  Negative for confusion, decreased concentration, dysphoric mood and sleep disturbance. The patient is not nervous/anxious.     Objective:  BP 120/77   Pulse 75   Temp 98 F (36.7 C)   Ht 5' 11 (1.803 m)   Wt 172 lb 3.2 oz (78.1 kg)   SpO2 97%   BMI 24.02 kg/m   BP Readings from Last 3 Encounters:  03/04/24 120/77  08/19/23 116/70  03/04/23 119/75    Wt Readings from Last 3 Encounters:  03/04/24 172 lb 3.2 oz (78.1 kg)  08/19/23 158 lb 3.2 oz (71.8 kg)  03/04/23 170 lb 6.4 oz (77.3 kg)     Physical Exam Vitals reviewed.  Constitutional:      Appearance: He is well-developed.  HENT:     Head: Normocephalic and atraumatic.     Right Ear: External ear normal.     Left Ear: External ear normal.     Mouth/Throat:     Pharynx: No oropharyngeal  exudate or posterior oropharyngeal erythema.  Eyes:     Pupils: Pupils are equal, round, and reactive to light.  Neck:     Thyroid : No thyromegaly.     Trachea: No tracheal deviation.  Cardiovascular:     Rate and Rhythm: Normal rate and regular rhythm.     Heart sounds: Normal heart sounds. No murmur heard.    No friction rub. No gallop.  Pulmonary:     Effort: No respiratory distress.     Breath sounds: Normal breath sounds. No wheezing or rales.  Abdominal:     General: Bowel sounds are normal. There is no distension.     Palpations: Abdomen is soft. There is no mass.     Tenderness:  There is no abdominal tenderness.     Hernia: There is no hernia in the left inguinal area.  Genitourinary:    Penis: Normal.      Testes: Normal.  Musculoskeletal:        General: Normal range of motion.     Cervical back: Normal range of motion and neck supple.  Lymphadenopathy:     Cervical: No cervical adenopathy.  Skin:    General: Skin is warm and dry.  Neurological:     Mental Status: He is alert and oriented to person, place, and time.      Assessment & Plan:  Well adult exam -     Bayer DCA Hb A1c Waived -     CBC with Differential/Platelet -     CMP14+EGFR -     Lipid panel -     Urinalysis -     PSA, total and free -     VITAMIN D  25 Hydroxy (Vit-D Deficiency, Fractures)  Screening for prostate cancer -     PSA, total and free  Lipid screening -     Lipid panel  Elevated glucose level -     Bayer DCA Hb A1c Waived -     CMP14+EGFR  Other idiopathic scoliosis, thoracolumbar region -     Nabumetone ; Take 2 tablets (1,000 mg total) by mouth 2 (two) times daily. For muscle and joint pain  Dispense: 360 tablet; Refill: 1  Follow-up: Return in about 1 year (around 03/04/2025) for [facility] physical.  Butler Der, M.D.                                                                          Assessment & Plan

## 2024-03-05 LAB — CMP14+EGFR
ALT: 22 IU/L (ref 0–44)
AST: 19 IU/L (ref 0–40)
Albumin: 4.7 g/dL (ref 3.8–4.9)
Alkaline Phosphatase: 66 IU/L (ref 44–121)
BUN/Creatinine Ratio: 18 (ref 9–20)
BUN: 17 mg/dL (ref 6–24)
Bilirubin Total: 0.4 mg/dL (ref 0.0–1.2)
CO2: 26 mmol/L (ref 20–29)
Calcium: 10 mg/dL (ref 8.7–10.2)
Chloride: 99 mmol/L (ref 96–106)
Creatinine, Ser: 0.94 mg/dL (ref 0.76–1.27)
Globulin, Total: 2.8 g/dL (ref 1.5–4.5)
Glucose: 94 mg/dL (ref 70–99)
Potassium: 4.8 mmol/L (ref 3.5–5.2)
Sodium: 139 mmol/L (ref 134–144)
Total Protein: 7.5 g/dL (ref 6.0–8.5)
eGFR: 98 mL/min/1.73 (ref >59)

## 2024-03-05 LAB — CBC WITH DIFFERENTIAL/PLATELET
Basophils Absolute: 0.1 x10E3/uL (ref 0.0–0.2)
Basos: 1 %
EOS (ABSOLUTE): 0.3 x10E3/uL (ref 0.0–0.4)
Eos: 4 %
Hematocrit: 48.8 % (ref 37.5–51.0)
Hemoglobin: 15.4 g/dL (ref 13.0–17.7)
Immature Grans (Abs): 0 x10E3/uL (ref 0.0–0.1)
Immature Granulocytes: 0 %
Lymphocytes Absolute: 2.1 x10E3/uL (ref 0.7–3.1)
Lymphs: 33 %
MCH: 27.7 pg (ref 26.6–33.0)
MCHC: 31.6 g/dL (ref 31.5–35.7)
MCV: 88 fL (ref 79–97)
Monocytes Absolute: 0.4 x10E3/uL (ref 0.1–0.9)
Monocytes: 6 %
Neutrophils Absolute: 3.6 x10E3/uL (ref 1.4–7.0)
Neutrophils: 56 %
Platelets: 276 x10E3/uL (ref 150–450)
RBC: 5.55 x10E6/uL (ref 4.14–5.80)
RDW: 13 % (ref 11.6–15.4)
WBC: 6.4 x10E3/uL (ref 3.4–10.8)

## 2024-03-05 LAB — LIPID PANEL
Cholesterol, Total: 179 mg/dL (ref 100–199)
HDL: 80 mg/dL (ref >39)
LDL CALC COMMENT:: 2.2 ratio (ref 0.0–5.0)
LDL Chol Calc (NIH): 85 mg/dL (ref 0–99)
Triglycerides: 78 mg/dL (ref 0–149)
VLDL Cholesterol Cal: 14 mg/dL (ref 5–40)

## 2024-03-05 LAB — PSA, TOTAL AND FREE
PSA, Free Pct: 26.5
PSA, Free: 0.82 ng/mL
Prostate Specific Ag, Serum: 3.1 ng/mL (ref 0.0–4.0)

## 2024-03-05 LAB — VITAMIN D 25 HYDROXY (VIT D DEFICIENCY, FRACTURES): Vit D, 25-Hydroxy: 40.5 ng/mL (ref 30.0–100.0)

## 2024-03-07 ENCOUNTER — Encounter: Payer: Self-pay | Admitting: Family Medicine

## 2024-03-08 ENCOUNTER — Ambulatory Visit: Payer: Self-pay | Admitting: Family Medicine

## 2024-03-08 NOTE — Progress Notes (Signed)
Hello Tyke,  Your lab result is normal and/or stable.Some minor variations that are not significant are commonly marked abnormal, but do not represent any medical problem for you.  Best regards, Deira Shimer, M.D.

## 2025-03-07 ENCOUNTER — Encounter: Payer: Self-pay | Admitting: Family Medicine
